# Patient Record
Sex: Female | Born: 1935 | Race: White | Hispanic: No | Smoking: Former smoker
Health system: Southern US, Community
[De-identification: ages and names within clinical notes are randomized; demographics above are authoritative.]

## PROBLEM LIST (undated history)

## (undated) DIAGNOSIS — M898X9 Other specified disorders of bone, unspecified site: Secondary | ICD-10-CM

## (undated) DIAGNOSIS — Z01812 Encounter for preprocedural laboratory examination: Secondary | ICD-10-CM

## (undated) DIAGNOSIS — M19019 Primary osteoarthritis, unspecified shoulder: Secondary | ICD-10-CM

## (undated) DIAGNOSIS — E785 Hyperlipidemia, unspecified: Secondary | ICD-10-CM

## (undated) DIAGNOSIS — I10 Essential (primary) hypertension: Secondary | ICD-10-CM

## (undated) DIAGNOSIS — I2510 Atherosclerotic heart disease of native coronary artery without angina pectoris: Secondary | ICD-10-CM

## (undated) HISTORY — PX: BACK SURGERY: SHX140

## (undated) HISTORY — PX: CORONARY ANGIOPLASTY WITH STENT PLACEMENT: SHX49

## (undated) HISTORY — DX: Hyperlipidemia, unspecified: E78.5

## (undated) HISTORY — DX: Atherosclerotic heart disease of native coronary artery without angina pectoris: I25.10

## (undated) HISTORY — PX: CARDIAC CATHETERIZATION: SHX172

## (undated) HISTORY — PX: ELBOW FRACTURE SURGERY: SHX616

## (undated) HISTORY — DX: Essential (primary) hypertension: I10

---

## 2010-05-02 LAB — METABOLIC PANEL, COMPREHENSIVE
A-G Ratio: 1.1 (ref 0.8–1.7)
ALT (SGPT): 54 U/L (ref 30–65)
AST (SGOT): 24 U/L (ref 15–37)
Albumin: 3.9 g/dL (ref 3.4–5.0)
Alk. phosphatase: 36 U/L — ABNORMAL LOW (ref 50–136)
Anion gap: 9 mmol/L (ref 5–15)
BUN/Creatinine ratio: 18 (ref 12–20)
BUN: 14 MG/DL (ref 7–18)
Bilirubin, total: 0.6 MG/DL (ref 0.1–0.9)
CO2: 25 MMOL/L (ref 21–32)
Calcium: 8.8 MG/DL (ref 8.4–10.4)
Chloride: 100 MMOL/L (ref 100–108)
Creatinine: 0.8 MG/DL (ref 0.6–1.3)
GFR est AA: >60 mL/min/{1.73_m2} (ref >60)
GFR est non-AA: >60 mL/min/{1.73_m2} (ref >60)
Globulin: 3.4 g/dL (ref 2.0–4.0)
Glucose: 105 MG/DL — ABNORMAL HIGH (ref 74–99)
Potassium: 3.6 MMOL/L (ref 3.5–5.5)
Protein, total: 7.3 g/dL (ref 6.4–8.2)
Sodium: 134 MMOL/L — ABNORMAL LOW (ref 136–145)

## 2010-05-02 LAB — CBC WITH AUTOMATED DIFF
ABS. BASOPHILS: 0 10*3/uL (ref 0.0–0.06)
ABS. EOSINOPHILS: 0.1 10*3/uL (ref 0.0–0.4)
ABS. LYMPHOCYTES: 2.2 10*3/uL (ref 0.9–3.6)
ABS. MONOCYTES: 0.6 10*3/uL (ref 0.05–1.2)
ABS. NEUTROPHILS: 5.7 10*3/uL (ref 1.8–8.0)
BASOPHILS: 0 % (ref 0–2)
EOSINOPHILS: 1 % (ref 0–5)
HCT: 37.8 % (ref 35.0–45.0)
HGB: 12.9 g/dL (ref 12.0–16.0)
LYMPHOCYTES: 26 % (ref 21–52)
MCH: 31.6 PG (ref 24.0–34.0)
MCHC: 34.1 g/dL (ref 31.0–37.0)
MCV: 92.6 FL (ref 74.0–97.0)
MONOCYTES: 7 % (ref 3–10)
MPV: 10.3 FL (ref 9.2–11.8)
NEUTROPHILS: 66 % (ref 40–73)
PLATELET: 295 10*3/uL (ref 135–420)
RBC: 4.08 M/uL — ABNORMAL LOW (ref 4.20–5.30)
RDW: 13.8 % (ref 11.6–14.5)
WBC: 8.7 10*3/uL (ref 4.6–13.2)

## 2010-05-02 LAB — PROTHROMBIN TIME + INR
INR: 1 (ref 0.0–1.2)
Prothrombin time: 13.3 SECS (ref 11.5–15.2)

## 2010-05-02 LAB — CARDIAC PANEL,(CK, CKMB & TROPONIN)
CK - MB: 1.6 ng/ml (ref 0.5–3.6)
CK-MB Index: 2.4 % (ref 0.0–4.0)
CK: 66 U/L (ref 26–192)
Troponin-I, QT: <0.04 NG/ML (ref 0.00–0.05)

## 2010-05-02 LAB — PTT: aPTT: 25.5 s (ref 24.6–37.7)

## 2010-05-02 LAB — NT-PRO BNP: NT pro-BNP: 931 PG/ML (ref 0–1800)

## 2010-05-03 LAB — TROPONIN I: Troponin-I, QT: 0.04 NG/ML (ref 0.00–0.05)

## 2010-05-03 LAB — TSH 3RD GENERATION: TSH: 1.62 u[IU]/mL (ref 0.51–6.27)

## 2012-10-29 LAB — METABOLIC PANEL, BASIC
Anion gap: 3 mmol/L (ref 3.0–18)
BUN/Creatinine ratio: 17 (ref 12–20)
BUN: 12 MG/DL (ref 7.0–18)
CO2: 30 MMOL/L (ref 21–32)
Calcium: 9.1 MG/DL (ref 8.5–10.1)
Chloride: 105 MMOL/L (ref 100–108)
Creatinine: 0.72 MG/DL (ref 0.6–1.3)
GFR est AA: >60 mL/min/{1.73_m2} (ref >60)
GFR est non-AA: >60 mL/min/{1.73_m2} (ref >60)
Glucose: 94 MG/DL (ref 74–99)
Potassium: 4.8 MMOL/L (ref 3.5–5.5)
Sodium: 138 MMOL/L (ref 136–145)

## 2012-10-29 LAB — URINALYSIS W/ RFLX MICROSCOPIC
Bilirubin: NEGATIVE
Glucose: NEGATIVE MG/DL
Ketone: NEGATIVE MG/DL
Leukocyte Esterase: NEGATIVE
Nitrites: NEGATIVE
Protein: NEGATIVE MG/DL
Specific gravity: <1.005 (ref 1.003–1.030)
Urobilinogen: 0.2 EU/DL (ref 0.2–1.0)
pH (UA): 6 (ref 5.0–8.0)

## 2012-10-29 LAB — CBC W/O DIFF
HCT: 34.9 % — ABNORMAL LOW (ref 35.0–45.0)
HGB: 11.9 g/dL — ABNORMAL LOW (ref 12.0–16.0)
MCH: 32.2 PG (ref 24.0–34.0)
MCHC: 34.1 g/dL (ref 31.0–37.0)
MCV: 94.3 FL (ref 74.0–97.0)
MPV: 10.1 FL (ref 9.2–11.8)
PLATELET: 268 10*3/uL (ref 135–420)
RBC: 3.7 M/uL — ABNORMAL LOW (ref 4.20–5.30)
RDW: 13.9 % (ref 11.6–14.5)
WBC: 6.3 10*3/uL (ref 4.6–13.2)

## 2012-10-29 LAB — EKG, 12 LEAD, INITIAL
Atrial Rate: 76 {beats}/min
Calculated P Axis: 75 degrees
Calculated R Axis: -29 degrees
Calculated T Axis: 97 degrees
P-R Interval: 174 ms
Q-T Interval: 436 ms
QRS Duration: 136 ms
QTC Calculation (Bezet): 490 ms
Ventricular Rate: 76 {beats}/min

## 2012-10-29 LAB — URINE MICROSCOPIC ONLY
Bacteria: NEGATIVE /HPF
RBC: 0 /HPF (ref 0–5)
WBC: NEGATIVE /HPF (ref 0–5)

## 2012-10-29 LAB — PROTHROMBIN TIME + INR
INR: 1.1 (ref 0.8–1.2)
Prothrombin time: 14 s (ref 11.5–15.2)

## 2012-10-29 LAB — PTT: aPTT: 27.9 s (ref 24.6–37.7)

## 2012-10-29 NOTE — Other (Signed)
Care fusion kit provided with instructions ,patient was instructed to bring her stent card on admission

## 2012-10-30 NOTE — H&P (Signed)
Patient Name:   Marissa Harrell, Marissa Harrell   Date of Birth:  Jan 13, 1928    Chief Complaint:  Back pain and right lower extremity pain.    History of Chief Complaint:t. Ms. Marissa Harrell is an 78 year old woman who is being seen for back and right lower extremity pain. The patient is being seen at the request of Dr. Cathlean Cower. She has had right lower extremity pain, inner thigh, and anterior lower leg pain for the past six or seven weeks. She recent had a sudden onset of pain in the right buttock and posterior thigh pain while walking. The pain travels down to the right foot.  She is an avid walker. She does not remember a specific injury. The pain has been somewhat intermittent. She has had no numbness. She has had some weakness in the right foot, and it seems to drag. She has had no bowel or bladder dysfunction or problems with her balance. She finds that walking, climbing stairs, and rolling over in bed make her pain worse. Her pain worsens throughout the day and is at its worst in the evenings.  Taking Percocet helps her a lot. She had a DEXA scan done in 2012 that revealed osteopenia.  She has been doing the exercise program which really does not seem to be helping; and in fact, the stretches seem to make it a bit worse. She gets numbness and tingling on the right side at times.She epidural steroid injection only helped for about 48 hours and the pain has returned again.  She still has difficulty with leaning forward on the right side.    Past Medical History:  Hypertension, rheumatoid disease, and Sj??gren???s syndrome.    Past Surgical History:  Appendectomy, cardiac stent placement, and ectopic pregnancy.    Allergies:   The patient has no known drug allergies.    Current Medications:  Nexium, Vytorin, Plaquenil, Micardis, Coreg, Evoxac, and Fosamax.     Social History:  The patient is a former smoker of cigarettes for ten years.    Family History:  Hypertension and heart disease.       Review of Systems:  A complete review of  systems was completed with pertinent positives including hemorrhoids.  Pertinent negatives include fever, chills, night sweats, nausea, vomiting, dark stools, chest pain, shortness of breath, edema, visual changes, dizziness, bowel or bladder dysfunction, hematuria, morning stiffness, wheezing, cough, tuberculosis, numbness, rashes, bruising, or difficulty sleeping.     Physical Examination:    General:  The patient appears healthy. Blood Pressure: 148/71; Height: 5 feet 1 inch; Weight: 90.   Cardiovascular:  Arterial pulses are normal.  Heart- RRR  Lungs-CTA bil  Abdomen- +BS,soft,nontender  Skin:  The skin is normal appearing with no contusions or wounds noted.  Musculoskeletal:  There is tenderness of the right PSIS and right greater trochanter. The thoracolumbar spine has normal kyphosis, a normal appearance, and no scoliosis.  There is full range of motion of the cervical, thoracic, and lumbar spine and of the hips, knees, and ankles.  Straight leg raising and crossed straight leg raising tests are negative.  Neurological:  There is no weakness in the thoracic, lumbar, or sacral spine or in the lower extremities or hips.  Deep tendon reflexes are present and normal bilaterally.  Ankle and knee jerks are normal with no clonus.     Radiograph Examination:  AP view of the pelvis was obtained and interpreted in the office 07/24/12  and reveals no periosteal reaction, no medullary lesions, no osteopenia,  well-aligned joint spaces, no chondrolysis, and no fractures.    AP, lateral, oblique, flexion, and extension views of the lumbar spine were obtained and interpreted in the office 07/24/12, which reveals left lateral subluxation of L4 on L5 as well as degenerative disc disease at L2-3.    Review of her MRI scan [facility] 08/22/12 reveals severe spinal stenosis at L3-4 as well as degenerative disc disease and spinal stenosis at L4-5.    Impression:  Ms. Marissa Harrell and I had a long discussion about treatments for her spinal stenosis  including surgical intervention, the risks, and benefits as well as the different surgical approaches and decision making.  We also discussed nonsurgical care for this condition including medications, injections, physical therapy, rehabilitation, activity modification, and brace utilization.  At this point, she would like to proceed with operative intervention.  We will plan for L3 to L5 decompression and fusion .  The risks and benefits have been described.

## 2012-10-31 ENCOUNTER — Encounter

## 2012-10-31 LAB — CULTURE, URINE
Culture result:: NO GROWTH
Culture: NO GROWTH

## 2012-10-31 NOTE — Other (Signed)
Notified Kay/Dr Lisette Grinder of anes request for stress test and xray followup.  Faxed xray,ekg and cardiac note

## 2012-11-04 ENCOUNTER — Inpatient Hospital Stay
Admit: 2012-11-04 | Discharge: 2012-11-07 | Disposition: A | Discharge status: Home Health | Payer: MEDICARE | Attending: Orthopaedic Surgery | Admitting: Orthopaedic Surgery

## 2012-11-04 DIAGNOSIS — M47817 Spondylosis without myelopathy or radiculopathy, lumbosacral region: Secondary | ICD-10-CM

## 2012-11-04 MED ADMIN — cycloSPORINE (RESTASIS) 0.05 % ophthalmic emulsion 1 Drop: OPHTHALMIC | @ 23:00:00 | NDC 00023916330

## 2012-11-04 MED ADMIN — HYDROmorphone (PF) (DILAUDID) injection 1 mg: INTRAVENOUS | @ 19:00:00 | NDC 00409128331

## 2012-11-04 MED ADMIN — sodium chloride (NS) flush 5-10 mL: INTRAVENOUS | @ 19:00:00 | NDC 87701099893

## 2012-11-04 MED ADMIN — cycloSPORINE (RESTASIS) 0.05 % ophthalmic emulsion 1 Drop: OPHTHALMIC | @ 18:00:00 | NDC 60432014050

## 2012-11-04 MED ADMIN — docusate sodium (COLACE) capsule 100 mg: ORAL | @ 19:00:00 | NDC 62584068311

## 2012-11-04 MED ADMIN — bacitracin 100,000 Units in 0.9% sodium chloride 1,000 mL Irrigation: @ 14:00:00 | NDC 00409798309

## 2012-11-04 MED ADMIN — ceFAZolin (ANCEF) 1 g in 0.9% sodium chloride (MBP/ADV) 50 mL MBP: INTRAVENOUS | @ 19:00:00 | NDC 00338055311

## 2012-11-04 MED ADMIN — acetaminophen (OFIRMEV) infusion 1,000 mg: INTRAVENOUS | @ 16:00:00 | NDC 43825010201

## 2012-11-04 MED ADMIN — HYDROmorphone (PF) (DILAUDID) 30 mg / 30 mL PCA: INTRAVENOUS | @ 17:00:00 | NDC 09999179730

## 2012-11-04 MED ADMIN — bacitracin 100,000 Units in lactated ringers 1,000 mL Irrigation: @ 14:00:00 | NDC 00409795309

## 2012-11-04 MED ADMIN — fentaNYL citrate (PF) injection 50 mcg: INTRAVENOUS | @ 16:00:00 | NDC 00641602701

## 2012-11-04 MED ADMIN — lactated ringers infusion: INTRAVENOUS | @ 20:00:00 | NDC 00409795309

## 2012-11-04 MED ADMIN — ondansetron (ZOFRAN) injection 4 mg: INTRAVENOUS | @ 20:00:00 | NDC 10019090501

## 2012-11-04 MED ADMIN — lactated ringers infusion: INTRAVENOUS | @ 12:00:00 | NDC 00409795309

## 2012-11-04 MED ADMIN — fentaNYL citrate (PF) 50 mcg/mL injection: @ 16:00:00 | NDC 00409909425

## 2012-11-04 MED ADMIN — ceFAZolin (ANCEF) 2g IVPB in 50 mL D5W: INTRAVENOUS | @ 13:00:00 | NDC 00264310511

## 2012-11-04 MED ADMIN — HYDROmorphone (PF) (DILAUDID) injection 1 mg: INTRAVENOUS | NDC 00409128331

## 2012-11-04 MED ADMIN — HYDROmorphone (PF) (DILAUDID) 30 mg / 30 mL PCA: INTRAVENOUS | @ 16:00:00 | NDC 09999179730

## 2012-11-04 MED FILL — HYDROMORPHONE (PF) 1 MG/ML IJ SOLN: 1 mg/mL | INTRAMUSCULAR | Qty: 1

## 2012-11-04 MED FILL — HEPARIN (PORCINE) 10,000 UNIT/ML IJ SOLN: 10000 unit/mL | INTRAMUSCULAR | Qty: 3

## 2012-11-04 MED FILL — ROCURONIUM 10 MG/ML IV: 10 mg/mL | INTRAVENOUS | Qty: 5

## 2012-11-04 MED FILL — LACTATED RINGERS IV: INTRAVENOUS | Qty: 1000

## 2012-11-04 MED FILL — VYTORIN 10 MG-40 MG TABLET: 10-40 mg | ORAL | Qty: 1

## 2012-11-04 MED FILL — MIDAZOLAM 1 MG/ML IJ SOLN: 1 mg/mL | INTRAMUSCULAR | Qty: 2

## 2012-11-04 MED FILL — OFIRMEV 1,000 MG/100 ML (10 MG/ML) INTRAVENOUS SOLUTION: 1000 mg/100 mL (10 mg/mL) | INTRAVENOUS | Qty: 100

## 2012-11-04 MED FILL — HYDROMORPHONE 30 MG/30 ML (1 MG/ML) IN 0.9% NACL INFUSION: 30 mg/ mL | INTRAVENOUS | Qty: 30

## 2012-11-04 MED FILL — FENTANYL CITRATE (PF) 50 MCG/ML IJ SOLN: 50 mcg/mL | INTRAMUSCULAR | Qty: 5

## 2012-11-04 MED FILL — BD POSIFLUSH NORMAL SALINE 0.9 % INJECTION SYRINGE: INTRAMUSCULAR | Qty: 10

## 2012-11-04 MED FILL — GLYCOPYRROLATE 0.2 MG/ML IJ SOLN: 0.2 mg/mL | INTRAMUSCULAR | Qty: 2

## 2012-11-04 MED FILL — DOCUSATE SODIUM 100 MG CAP: 100 mg | ORAL | Qty: 1

## 2012-11-04 MED FILL — CEFAZOLIN 2 GM/50 ML IN DEXTROSE (ISO-OSMOTIC) IVPB: 2 gram/50 mL | INTRAVENOUS | Qty: 50

## 2012-11-04 MED FILL — BACITRACIN 50,000 UNIT IM: 50000 unit | INTRAMUSCULAR | Qty: 200000

## 2012-11-04 MED FILL — RESTASIS 0.05 % EYE DROPS IN A DROPPERETTE: 0.05 % | OPHTHALMIC | Qty: 1

## 2012-11-04 MED FILL — ONDANSETRON (PF) 4 MG/2 ML INJECTION: 4 mg/2 mL | INTRAMUSCULAR | Qty: 2

## 2012-11-04 MED FILL — SODIUM CHLORIDE 0.9 % IV: INTRAVENOUS | Qty: 1000

## 2012-11-04 MED FILL — CEFAZOLIN 1 GRAM SOLUTION FOR INJECTION: 1 gram | INTRAMUSCULAR | Qty: 1000

## 2012-11-04 MED FILL — XYLOCAINE-MPF 20 MG/ML (2 %) INJECTION SOLUTION: 20 mg/mL (2 %) | INTRAMUSCULAR | Qty: 5

## 2012-11-04 MED FILL — FENTANYL CITRATE (PF) 50 MCG/ML IJ SOLN: 50 mcg/mL | INTRAMUSCULAR | Qty: 2

## 2012-11-04 MED FILL — SODIUM CHLORIDE 0.9 % INJECTION: INTRAMUSCULAR | Qty: 20

## 2012-11-04 MED FILL — PHENYLEPHRINE 10 MG/ML INJECTION: 10 mg/mL | INTRAMUSCULAR | Qty: 1

## 2012-11-04 MED FILL — DEXAMETHASONE SODIUM PHOSPHATE 4 MG/ML IJ SOLN: 4 mg/mL | INTRAMUSCULAR | Qty: 1

## 2012-11-04 MED FILL — FLEET ENEMA 19 GRAM-7 GRAM/118 ML: 19-7 gram/118 mL | RECTAL | Qty: 133

## 2012-11-04 MED FILL — PROPOFOL 10 MG/ML IV EMUL: 10 mg/mL | INTRAVENOUS | Qty: 20

## 2012-11-04 MED FILL — HESPAN 6 % IN NS INTRAVENOUS SOLUTION: 6 % | INTRAVENOUS | Qty: 500

## 2012-11-04 NOTE — Progress Notes (Signed)
Bedside and Verbal shift change report given to B.Bagsby RN (oncoming nurse) by S.Thomas RN (offgoing nurse).  Report given with SBAR, Kardex and MAR.

## 2012-11-04 NOTE — Progress Notes (Signed)
Dr. Almon Register, hospitalist, returned paged for medical consult.

## 2012-11-04 NOTE — Op Note (Signed)
Patient: Marissa Harrell MRN: [id number]  SSN: [id number]    Date of Birth: 11/04/1928  Age: 78 y.o.  Sex: female        Date of Procedure: 11/04/2012   Preoperative Diagnosis: LUMBAR SPONDYLOSIS, LUMBAGO,SCIATICA, SPINAL STENOSIS, HISTORY CARDIOVASCULAR DISEASE  (CARDIAC STENTS)  Postoperative Diagnosis: LUMBAR SPONDYLOSIS, SCIATICA    Procedure: Procedure(s) with comments:  L3-5 DECOMPRESSION & FUSION W/C-ARM - L3-5 DECOMPRESSION & FUSION W/C-ARM  Surgeon(s) and Role:     * Gurnie Duris R Yahir Tavano, MD - Primary  Assistant: Tonia Yocum PA-C  Anesthesia: General   Estimated Blood Loss: 100cc  Fluids: 1000cc   Specimens: * No specimens in log *   Findings: same  Complications: none  Implants:   Implant Name Type Inv. Item Serial No. Manufacturer Lot No. LRB No. Used Action   GRAFT BNE DEMINERALIZED 10CC -- DBMPURE MACRO - [id number]  [id number] SPINEFRONTIER  N/A 1 Implanted   SCR PDCL SLD PEDFUSE 7.0X50MM --  - [id number]  [id number]  SPINEFRONTIER [id number] N/A 4 Implanted   ROD SP LORDTC PEDFUSE 5.5X75MM --  - [id number]  [id number]  SPINEFRONTIER [id number] N/A 2 Implanted   SCR SET PEDFUSE 5-7MM --  - [id number]  [id number]  SPINEFRONTIER [id number] N/A 6 Implanted   SCR PDCL SLD PEDFUSE 7.0X45MM --  - [id number]  [id number]  SPINEFRONTIER [id number] N/A 2 Implanted   CONN X ADJ PEDFUSE ANGL 55MM --  - [id number]   [id number]   SPINEFRONTIER [id number] N/A 1 Implanted         Indications: This is a 78 y.o. year-old female who presents with significant back   and bilateral lower extremity pain, worsening ability to do activities of daily living. X-rays and MRI scan revealing severe spinal stenosis, degenerative disk disease and disk herniation. Having failed conservative care, he comes for operative intervention.      DESCRIPTION OF PROCEDURE: After correct identification, the patient was   taken to the operating room, placed supine on the table, general   endotracheal anesthesia induced.  2grams of Ancef was given. Patient  was then rotated prone onto the Jackson table. Lumbar spine was prepped and draped in the usual sterile fashion. Midline incision was made in the lumbar spine, taken down to the spinous processes of L2-5.  The posterior transverse processes bilaterally were expose at L3-5 as seen on intraoperative fluoroscopy. Gelpi retractors were then placed. The wound was then irrigated.   Complete wide laminectomy was performed at L4 as well as the inferior   half of L3 bilaterally and the superior half of L5 bilaterally. Removal of more than 1/2 of the medial facets bilaterally allowed excellent midline decompression. Foraminotomies which included undercutting the pars intra-articularis were then performed bilaterally at L3-5 until a Penfield 3 was easily passed through each of the neural foramen. The wound   was then irrigated. Bur was then used to open the posterior aspect of the   pedicles bilaterally at L3-5. A gearshift probe was then placed through   each of these posterior bur holes, through the pedicle, into vertebral body   under intraoperative fluoroscopy. Ball-tipped probe was then placed through   each gearshift tracts, ensuring the gearshift had only gone to the bone.   Pedicle screws from SpineFrontier spinal system were then placed bilaterally at L3-5.   Rods were then fixed to the pedicle screws using the locking caps. Each of these caps were then torqued into position. Intraoperative x-ray revealed excellent alignment of the  hardware and anatomy. Wound   was then irrigated with 1000cc of pulse lavage irrigation containing Bacitracin. Bur was then used to open the posterior aspect of the facet joints and transverse process bilaterally as well as removing of the cartilage surface of the facet joints.  Bone graft that was taken from the laminectomy site was then placed in the   posterolateral gutters as well as in facet joints. Drain was then placed.   Fascia was then closed using #1 Vicryl suture. Subcutaneous  tissue   approximated with 2-0 Vicryl suture. Skin approximated with staples. Sterile dressing was applied.   The patient tolerated the procedure well and taken to Recovery room in good   condition.

## 2012-11-04 NOTE — Other (Addendum)
TRANSFER - OUT REPORT:    Verbal report given to Trula Ore RN,  on Marissa Harrell  being transferred to 2AB(unit) for routine post - op       Report consisted of patient???s Situation, Background, Assessment and   Recommendations(SBAR).     Information from the following report(s) SBAR and Procedure Summary was reviewed with the receiving nurse.    Opportunity for questions and clarification was provided.

## 2012-11-04 NOTE — Consults (Signed)
History and Physical    Patient: Marissa Harrell               Sex: female          DOA: 11/04/2012       Date of Birth:  1928/08/05      Age:  78 y.o.        LOS:  LOS: 0 days        HPI:     Jesi Goldhammer is a 78 y.o. female who presented to the hospital by the Orthopedics service for lumbar spine surgery.  She has a known history of CAD and has had three stents placed, her Cardiologist is Dr Tereasa Coop.  She has tolerated the surgery well.  She reports that her heart disease has been well controlled and that she has not been having chest pain.  She has not shortness of breath.  She has significant dry mouth symptoms because of her Sjogren's syndrome.  We are consulted for assistance with ongoing medical management.    Past Medical History   Diagnosis Date   ??? Sjogren's syndrome    ??? Hypertension    ??? Heart attack    ??? Arthritis    ??? CAD (coronary artery disease)      s/p stents, EF 33%   ??? GERD (gastroesophageal reflux disease)      well controlled   ??? Psychiatric disorder      anxiety   ??? Nausea & vomiting    ??? Chronic kidney disease      renal artery stenosis       Social History:   Tobacco use:  Patient reports stopping smoking 40 years ago   Alcohol use:  Patient occasionally drinks red wine with dinner   Occupation:  Patient lives alone, she is a Armed forces training and education officer.    Family History:   Patient's father died in his 59's of a heart attack   Patient's mother died when the patient was an infant.    Review of Systems    Constitutional:  No fever or weight loss  HEENT:  No headache or visual changes  Cardiovascular:  No chest pain or diaphoresis  Respiratory:  No coughing, wheezing, or shortness of breath.  GI:  No nausea or vomitting.  No diarrhea  GU:  No hematuria or dysuria  Skin:  No rashes or moles  Neuro:  No seizures or syncope  Hematological:  No bruising or bleeding  Endocrine:  No diabetes or thyroid disease    Physical Exam:      Visit Vitals   Item Reading   ??? BP 105/51   ??? Pulse 76   ??? Temp 97.3 ??F (36.3 ??C)    ??? Resp 14   ??? Ht 5\' 1"  (1.549 m)   ??? Wt 39.945 kg (88 lb 1 oz)   ??? BMI 16.65 kg/m2   ??? SpO2 99%       Physical Exam:    Gen:  No distress, alert  HEENT:  Normal cephalic atraumatic, extra-occular movements are intact.  Neck:  Supple, No JVD  Lungs:  Clear bilaterally, no wheeze, no rales, normal effort  Heart:  Regular Rate and Rhythm, normal S1 and S2, no edema  Abdomen:  Soft, non tender, normal bowel sounds, no guarding.  Extremities:  Well perfused, no cyanosis or edema  Neurological:  Awake and alert, CN's are intact, normal strength throughout extremities  Skin:  No rashes or moles  Mental Status:  Normal thought process,  does not appear anxious    Laboratory Studies:    BMP:   No results found for this basename: na, k, cl, co2, AGAP, glu, bun, crea, GFRAA, GFRNA     CBC:   No results found for this basename: wbc, hgb, hct, plt       Assessment/Plan     Principal Problem:    Spinal stenosis, lumbar (10/30/2012)    Active Problems:    DDD (degenerative disc disease), lumbar (10/30/2012)      CAD  Sjogren's syndrome  HTN  Renal artery stenosis    PLAN:    Patient has significant comorbid illness but it seems to be well controlled.  Will monitor BP and renal function  Will also monitor H/H  Pain control and mobilization per Ortho.

## 2012-11-04 NOTE — Progress Notes (Signed)
Received report from B.Patrick North RN, PACU for patient transfer to room #229.

## 2012-11-04 NOTE — Interval H&P Note (Signed)
H&P Update:  Marissa Harrell was seen and examined.  History and physical has been reviewed. There have been no significant clinical changes since the completion of the originally dated History and Physical.    Signed By: Dixon Boos, MD     November 04, 2012 7:25 AM

## 2012-11-04 NOTE — Progress Notes (Signed)
Post-Anesthesia Evaluation & Assessment    Visit Vitals   Item Reading   ??? BP 162/86   ??? Pulse 76   ??? Temp 97.7 ??F (36.5 ??C)   ??? Resp 14   ??? Ht 5\' 1"  (1.549 m)   ??? Wt 39.945 kg (88 lb 1 oz)   ??? BMI 16.65 kg/m2   ??? SpO2 100%       Nausea/Vomiting: Controlled.    Post-operative hydration adequate.    Pain Scale 1: Numeric (0 - 10) (11/04/12 1120)  Pain Intensity 1: 3 (11/04/12 1120)   Managed    Pain score at or below stated goal level.    Mental status & Level of consciousness: alert and oriented x 3    Neurological status: moves all extremities, sensation grossly intact    Pulmonary status: airway patent, adequate oxygenation.    Complications related to anesthesia: none    Patient has met all discharge requirements.      Hulan Amato Arhan Mcmanamon, DO

## 2012-11-04 NOTE — Progress Notes (Signed)
PT order received and chart reviewed. Holding PT at this time due to pt being in severe pain per nursing. Will f/u later as time allows. Thank you.

## 2012-11-04 NOTE — Progress Notes (Signed)
Assumed care of patient. Patient in bed, HOB elevated. Pain voiced at 2/10. Rest as intervention. Encouraged PCA use.Shift assessment completed. All questions and concerns answered. All needs met. Bed in the lowest position. Call bell within reach. Will continue to monitor for changes. Daughter at bedside.

## 2012-11-04 NOTE — Progress Notes (Signed)
Received from PACU to room #229 via bed accompanied by PACU nurse and transporter.Patient received in satisfactory condition. Post op l3-5 decompression and fusion. Surgeon is Dr. Lisette Grinder. Dilaudid PCA and peripheral iv infusing via without incidence. Full body assessment completed,see doc flow sheet in computer. Dressing lumbar clean dry intact with hemovac in place with mod amt sanguineous drainage neurovascular status intact.denies numb and or tingling sensation.positive pulses dorsal/tibial. Instructed in ordering diet.  Side rails up x3, call light with in reach on bed.

## 2012-11-04 NOTE — Op Note (Signed)
Patient: Marissa Harrell MRN: [id number]  SSN: [id number]    Date of Birth: [birth year]  Age: 78 y.o.  Sex: female        Date of Procedure: 11/04/2012   Preoperative Diagnosis: LUMBAR SPONDYLOSIS, LUMBAGO,SCIATICA, SPINAL STENOSIS, HISTORY CARDIOVASCULAR DISEASE  (CARDIAC STENTS)  Postoperative Diagnosis: LUMBAR SPONDYLOSIS, SCIATICA    Procedure: Procedure(s) with comments:  L3-5 DECOMPRESSION & FUSION W/C-ARM - L3-5 DECOMPRESSION & FUSION W/C-ARM  Surgeon(s) and Role:     * Dixon Boos, MD - Primary  Assistant: Luane School PA-C  Anesthesia: General   Estimated Blood Loss: 100cc  Fluids: 1000cc   Specimens: * No specimens in log *   Findings: same  Complications: none  Implants:   Implant Name Type Inv. Item Serial No. Manufacturer Lot No. LRB No. Used Action   GRAFT BNE DEMINERALIZED 10CC -- DBMPURE MACRO - [id number]  [id number] SPINEFRONTIER  N/A 1 Implanted   SCR PDCL SLD PEDFUSE 7.0X50MM --  - [id number]  [id number]  SPINEFRONTIER [id number] N/A 4 Implanted   ROD SP LORDTC PEDFUSE 5.5X75MM --  - [id number]  [id number]  SPINEFRONTIER [id number] N/A 2 Implanted   SCR SET PEDFUSE 5-7MM --  - [id number]  [id number]  SPINEFRONTIER [id number] N/A 6 Implanted   SCR PDCL SLD PEDFUSE 7.0X45MM --  - [id number]  [id number]  SPINEFRONTIER [id number] N/A 2 Implanted   CONN X ADJ PEDFUSE ANGL --  - [id number]   [id number]   SPINEFRONTIER [id number] N/A 1 Implanted         Indications: This is a 78 y.o. year-old female who presents with significant back   and bilateral lower extremity pain, worsening ability to do activities of daily living. X-rays and MRI scan revealing severe spinal stenosis, degenerative disk disease and disk herniation. Having failed conservative care, he comes for operative intervention.      DESCRIPTION OF PROCEDURE: After correct identification, the patient was   taken to the operating room, placed supine on the table, general   endotracheal anesthesia induced.  2grams of Ancef was given. Patient  was then rotated prone onto the [facility] table. Lumbar spine was prepped and draped in the usual sterile fashion. Midline incision was made in the lumbar spine, taken down to the spinous processes of L2-5.  The posterior transverse processes bilaterally were expose at L3-5 as seen on intraoperative fluoroscopy. Gelpi retractors were then placed. The wound was then irrigated.   Complete wide laminectomy was performed at L4 as well as the inferior   half of L3 bilaterally and the superior half of L5 bilaterally. Removal of more than 1/2 of the medial facets bilaterally allowed excellent midline decompression. Foraminotomies which included undercutting the pars intra-articularis were then performed bilaterally at L3-5 until a Penfield 3 was easily passed through each of the neural foramen. The wound   was then irrigated. Bur was then used to open the posterior aspect of the   pedicles bilaterally at L3-5. A gearshift probe was then placed through   each of these posterior bur holes, through the pedicle, into vertebral body   under intraoperative fluoroscopy. Ball-tipped probe was then placed through   each gearshift tracts, ensuring the gearshift had only gone to the bone.   Pedicle screws from SpineFrontier spinal system were then placed bilaterally at L3-5.   Rods were then fixed to the pedicle screws using the locking caps. Each of these caps were then torqued into position. Intraoperative x-ray revealed excellent alignment of the  hardware and anatomy. Wound  was then irrigated with 1000cc of pulse lavage irrigation containing Bacitracin. Bur was then used to open the posterior aspect of the facet joints and transverse process bilaterally as well as removing of the cartilage surface of the facet joints.  Bone graft that was taken from the laminectomy site was then placed in the   posterolateral gutters as well as in facet joints. Drain was then placed.   Fascia was then closed using #1 Vicryl suture. Subcutaneous  tissue   approximated with 2-0 Vicryl suture. Skin approximated with staples. Sterile dressing was applied.   The patient tolerated the procedure well and taken to Recovery room in good   condition.

## 2012-11-05 LAB — METABOLIC PANEL, BASIC
Anion gap: 8 mmol/L (ref 3.0–18)
BUN/Creatinine ratio: 16 (ref 12–20)
BUN: 14 MG/DL (ref 7.0–18)
CO2: 24 MMOL/L (ref 21–32)
Calcium: 7.5 MG/DL — ABNORMAL LOW (ref 8.5–10.1)
Chloride: 103 MMOL/L (ref 100–108)
Creatinine: 0.89 MG/DL (ref 0.6–1.3)
GFR est AA: >60 mL/min/{1.73_m2} (ref >60)
GFR est non-AA: >60 mL/min/{1.73_m2} (ref >60)
Glucose: 96 MG/DL (ref 74–99)
Potassium: 3.5 MMOL/L (ref 3.5–5.5)
Sodium: 135 MMOL/L — ABNORMAL LOW (ref 136–145)

## 2012-11-05 LAB — CBC WITH AUTOMATED DIFF
ABS. BASOPHILS: 0 10*3/uL (ref 0.0–0.06)
ABS. EOSINOPHILS: 0 10*3/uL (ref 0.0–0.4)
ABS. LYMPHOCYTES: 1.8 10*3/uL (ref 0.9–3.6)
ABS. MONOCYTES: 0.9 10*3/uL (ref 0.05–1.2)
ABS. NEUTROPHILS: 6.4 10*3/uL (ref 1.8–8.0)
BASOPHILS: 0 % (ref 0–2)
EOSINOPHILS: 0 % (ref 0–5)
HCT: 24.6 % — ABNORMAL LOW (ref 35.0–45.0)
HGB: 8.4 g/dL — ABNORMAL LOW (ref 12.0–16.0)
LYMPHOCYTES: 20 % — ABNORMAL LOW (ref 21–52)
MCH: 31.9 PG (ref 24.0–34.0)
MCHC: 34.1 g/dL (ref 31.0–37.0)
MCV: 93.5 FL (ref 74.0–97.0)
MONOCYTES: 9 % (ref 3–10)
MPV: 10.1 FL (ref 9.2–11.8)
NEUTROPHILS: 71 % (ref 40–73)
PLATELET: 189 10*3/uL (ref 135–420)
RBC: 2.63 M/uL — ABNORMAL LOW (ref 4.20–5.30)
RDW: 13.6 % (ref 11.6–14.5)
WBC: 9 10*3/uL (ref 4.6–13.2)

## 2012-11-05 MED ADMIN — carvedilol (COREG) tablet 6.25 mg: ORAL | @ 14:00:00 | NDC 68084026111

## 2012-11-05 MED ADMIN — ondansetron (ZOFRAN) injection 4 mg: INTRAVENOUS | @ 21:00:00 | NDC 00409475503

## 2012-11-05 MED ADMIN — sodium chloride (NS) flush 5-10 mL: INTRAVENOUS | @ 11:00:00 | NDC 87701099893

## 2012-11-05 MED ADMIN — acetaminophen (TYLENOL) tablet 650 mg: ORAL | @ 15:00:00 | NDC 00904198261

## 2012-11-05 MED ADMIN — ceFAZolin (ANCEF) 1 g in 0.9% sodium chloride (MBP/ADV) 50 mL MBP: INTRAVENOUS | @ 10:00:00 | NDC 00781345170

## 2012-11-05 MED ADMIN — cycloSPORINE (RESTASIS) 0.05 % ophthalmic emulsion 1 Drop: OPHTHALMIC | @ 14:00:00 | NDC 00023916330

## 2012-11-05 MED ADMIN — ondansetron (ZOFRAN) injection 4 mg: INTRAVENOUS | @ 16:00:00 | NDC 00409475503

## 2012-11-05 MED ADMIN — HYDROmorphone (PF) (DILAUDID) 30 mg / 30 mL PCA: INTRAVENOUS | @ 01:00:00 | NDC 09999179730

## 2012-11-05 MED ADMIN — docusate sodium (COLACE) capsule 100 mg: ORAL | @ 14:00:00 | NDC 62584068311

## 2012-11-05 MED ADMIN — sodium chloride (NS) flush 5-10 mL: INTRAVENOUS | @ 02:00:00 | NDC 87701099893

## 2012-11-05 MED ADMIN — docusate sodium (COLACE) capsule 100 mg: ORAL | @ 23:00:00 | NDC 62584068311

## 2012-11-05 MED ADMIN — ceFAZolin (ANCEF) 1 g in 0.9% sodium chloride (MBP/ADV) 50 mL MBP: INTRAVENOUS | @ 02:00:00 | NDC 00781345170

## 2012-11-05 MED ADMIN — HYDROmorphone (PF) (DILAUDID) injection 1 mg: INTRAVENOUS | @ 11:00:00 | NDC 00409128331

## 2012-11-05 MED ADMIN — HYDROmorphone (DILAUDID) tablet 2-4 mg: ORAL | @ 15:00:00 | NDC 68084042311

## 2012-11-05 MED ADMIN — carvedilol (COREG) tablet 6.25 mg: ORAL | @ 23:00:00 | NDC 68084026111

## 2012-11-05 MED ADMIN — telmisartan (MICARDIS) tablet 20 mg: ORAL | @ 15:00:00 | NDC 00597004037

## 2012-11-05 MED ADMIN — HYDROmorphone (PF) (DILAUDID) 30 mg / 30 mL PCA: INTRAVENOUS | @ 12:00:00 | NDC 09999179730

## 2012-11-05 MED ADMIN — cycloSPORINE (RESTASIS) 0.05 % ophthalmic emulsion 1 Drop: OPHTHALMIC | @ 23:00:00 | NDC 00023916330

## 2012-11-05 MED ADMIN — magnesium oxide (MAG-OX) tablet 200 mg: ORAL | @ 15:00:00 | NDC 63739035410

## 2012-11-05 MED ADMIN — hydroxychloroquine (PLAQUENIL) tablet 200 mg: ORAL | @ 15:00:00 | NDC 68084026911

## 2012-11-05 MED FILL — DOCUSATE SODIUM 100 MG CAP: 100 mg | ORAL | Qty: 1

## 2012-11-05 MED FILL — HYDROMORPHONE 2 MG TAB: 2 mg | ORAL | Qty: 1

## 2012-11-05 MED FILL — SIMVASTATIN 20 MG TAB: 20 mg | ORAL | Qty: 2

## 2012-11-05 MED FILL — ZETIA 10 MG TABLET: 10 mg | ORAL | Qty: 1

## 2012-11-05 MED FILL — CEFAZOLIN 1 GRAM SOLUTION FOR INJECTION: 1 gram | INTRAMUSCULAR | Qty: 1000

## 2012-11-05 MED FILL — ONDANSETRON (PF) 4 MG/2 ML INJECTION: 4 mg/2 mL | INTRAMUSCULAR | Qty: 2

## 2012-11-05 MED FILL — MICARDIS 40 MG TABLET: 40 mg | ORAL | Qty: 1

## 2012-11-05 MED FILL — HYDROXYCHLOROQUINE 200 MG TAB: 200 mg | ORAL | Qty: 1

## 2012-11-05 MED FILL — RESTASIS 0.05 % EYE DROPS IN A DROPPERETTE: 0.05 % | OPHTHALMIC | Qty: 1

## 2012-11-05 MED FILL — DIAZEPAM 5 MG TAB: 5 mg | ORAL | Qty: 1

## 2012-11-05 MED FILL — MAGNESIUM OXIDE 400 MG TAB: 400 mg | ORAL | Qty: 1

## 2012-11-05 MED FILL — CARVEDILOL 3.125 MG TAB: 3.125 mg | ORAL | Qty: 2

## 2012-11-05 MED FILL — MAPAP (ACETAMINOPHEN) 325 MG TABLET: 325 mg | ORAL | Qty: 2

## 2012-11-05 MED FILL — HYDROMORPHONE (PF) 1 MG/ML IJ SOLN: 1 mg/mL | INTRAMUSCULAR | Qty: 1

## 2012-11-05 NOTE — Progress Notes (Signed)
Bedside and Verbal shift change report given to T. Samaheen, RN (oncoming nurse) by I. Balcorta, RN (offgoing nurse).  Report given with SBAR, Kardex and MAR.

## 2012-11-05 NOTE — Progress Notes (Signed)
Pt is lying in bed.  Pt has headache and back pain. Pt was medicated with Tylenol 650 mg and Dilaudid 2 mg po.  Pt's PCA was discontinued earlier this morning.  Abd dressing to back dry and intact.  Hemovac intact draining sanguinous drainage.  Wit foley cath draining clear yellow urine.

## 2012-11-05 NOTE — Progress Notes (Signed)
Problem: Mobility Impaired (Adult and Pediatric)  Goal: *Acute Goals and Plan of Care (Insert Text)  Physical Therapy Goals  Initiated 11/05/2012 and to be accomplished within 1-4 day(s)  Bed mobility: Demonstrate proper log-roll technique for safe initiation of rolling for OOB activities.  Transfers:   Supine to sit to supine S with HR for meals.  Sit to stand to sit S with RW/LSO in prep for ambulation.  Gait: Ambulate 181ft S with RW/LSO, for home/community mobility.  Activity tolerance: Tolerate up in chair 30 minutes-1 hour for ADL???s.  Patient/Family Education: Patient/family to be independent with HEP for follow-up care and safe discharge.   Outcome: Progressing Towards Goal  PHYSICAL THERAPY TREATMENT    Patient: Marissa Harrell (78 y.o. female)  Date: 11/05/2012  Diagnosis: LUMBAR SPONDYLOSIS, LUMBAGO,SCIATICA, SPINAL STENOSIS, HISTORY CARDIOVASCULAR DISEASE (CARDIAC STENTS)  Spinal stenosis, lumbar Spinal stenosis, lumbar  Procedure(s) (LRB):  L3-5 DECOMPRESSION & FUSION W/C-ARM (N/A) 1 Day Post-Op  Precautions: Fall;Spinal;Back  Chart, physical therapy assessment, plan of care and goals were reviewed.      ASSESSMENT:  Patient showing slight improvement with mobility. Pt able to increase ambulation distance. Pt demonstrating a decrease tolerance to activity and need multiple standing rest. Cont POC.    Progression toward goals:  [ ]       Improving appropriately and progressing toward goals  [X]       Improving slowly and progressing toward goals  [ ]       Not making progress toward goals and plan of care will be adjusted       PLAN:  Patient continues to benefit from skilled intervention to address the above impairments. Continue treatment per established plan of care.  Discharge Recommendations:  Skilled Nursing Facility  Further Equipment Recommendations for Discharge:  rolling walker       SUBJECTIVE:   Patient stated ??? Im really in pain???      OBJECTIVE DATA SUMMARY:   Critical Behavior:  Neurologic  State: Alert;Appropriate for age  Orientation Level: Oriented X4  Cognition: Appropriate decision making;Appropriate for age attention/concentration;Appropriate safety awareness;Follows commands  Safety/Judgement: Awareness of environment;Fall prevention;Good awareness of safety precautions;Insight into deficits  Functional Mobility Training:  Bed Mobility:  Rolling: CGA  Supine to Sit: Minimal assistance;Additional time;Verbal cues  Sit to Supine: Moderate assistance;Additional time;Verbal cues  Scooting: CGA;Verbal cues  Transfers:  Sit to Stand: Minimum assistance;Verbal cues  Stand to Sit: CGA;Verbal cues  Balance:  Sitting: Intact  Standing: Intact;With support  Ambulation/Gait Training:  Distance (ft): 30 Feet (ft)  Assistive Device: Brace/Splint;Gait belt;Walker, rolling  Ambulation - Level of Assistance: Minimal assistance  Gait Description (WDL): Exceptions to WDL  Gait Abnormalities: Antalgic;Step to gait  Base of Support: Widened  Stance: Weight shift  Speed/Cadence: Slow  Step Length: Right shortened;Left shortened  Swing Pattern: Left asymmetrical;Right asymmetrical  Interventions: Verbal cues;Safety awareness training    Pain:  Pain Scale 1: Numeric (0 - 10)  Pain Intensity 1: 3  Pain Location 1: Back  Pain Orientation 1: Lower  Pain Description 1: Aching  Pain Intervention(s) 1: Rest  Activity Tolerance:   Fair     After treatment:   [ ]  Patient left in no apparent distress sitting up in chair  [X]  Patient left in no apparent distress in bed  [X]  Call bell left within reach  [ ]  Nursing notified  [X]  Caregiver present  [ ]  Bed alarm activated      Westly Pam, PTA   Time Calculation: 29 mins

## 2012-11-05 NOTE — Progress Notes (Signed)
Bedside and Verbal shift change report given to Irene, RN (oncoming nurse) by Brittany Bagby, RN (offgoing nurse).  Report given with SBAR, MAR and Recent Results.

## 2012-11-05 NOTE — Progress Notes (Signed)
Progress Note      Patient: Marissa Harrell               Sex: female          DOA: 11/04/2012         Date of Birth:  Oct 03, 1928      Age:  78 y.o.        LOS:  LOS: 1 day               Subjective:   No acute changes overnight, patient has no complaints today    Objective:      Visit Vitals   Item Reading   ??? BP 128/71   ??? Pulse 73   ??? Temp 97.9 ??F (36.6 ??C)   ??? Resp 14   ??? Ht 5\' 1"  (1.549 m)   ??? Wt 39.945 kg (88 lb 1 oz)   ??? BMI 16.65 kg/m2   ??? SpO2 98%       Physical Exam:  Gen: Nad, Non-toxic.   Heent:  NCAT. Oropharynx wnl.   Neck: Supple no masses  Cor: S1S2 RRR  Resp: CTA B/L  Abd: Soft, NT, ND      Intake and Output:  Last three shifts:  11/18 1900 - 11/20 0659  In: 8109.2 [P.O.:1160; I.V.:6447.9]  Out: 2595 [Urine:2125; Drains:370]    Lab/Data Reviewed:  BMP:   Lab Results   Component Value Date/Time    NA 135* 11/05/2012  2:05 AM    K 3.5 11/05/2012  2:05 AM    CL 103 11/05/2012  2:05 AM    CO2 24 11/05/2012  2:05 AM    AGAP 8 11/05/2012  2:05 AM    GLU 96 11/05/2012  2:05 AM    BUN 14 11/05/2012  2:05 AM    CREA 0.89 11/05/2012  2:05 AM    GFRAA >60 11/05/2012  2:05 AM    GFRNA >60 11/05/2012  2:05 AM     CBC:   Lab Results   Component Value Date/Time    WBC 9.0 11/05/2012  2:05 AM    HGB 8.4* 11/05/2012  2:05 AM    HCT 24.6* 11/05/2012  2:05 AM    PLT 189 11/05/2012  2:05 AM       Medications Reviewed        Assessment/Plan     Active Problems:    DDD (degenerative disc disease), lumbar (10/30/2012)      HTN (hypertension) (11/05/2012)      Anemia, unspecified (11/05/2012)      Esophageal reflux (11/05/2012)        Plan:  Continue blood pressure control  Monitor hemoglobin  Supportive care

## 2012-11-05 NOTE — Progress Notes (Signed)
Pt is awake, alert, oriented x 3.  Pain level 3/10.  Does not need pain med at this time.  Pt complaining of nausea.  Medicated with Zofran 4 mg IV.  Pt voided 250 ml of clear yellow urine.

## 2012-11-05 NOTE — Progress Notes (Signed)
Progress Note POD #1      Patient: Marissa Harrell               Sex: female          DOA: 11/04/2012         Date of Birth:  Apr 29, 1928      Surgery: Procedure(s):  L3-5 DECOMPRESSION & FUSION W/C-ARM           LOS: 1 day               Subjective:     C/O nausea after using PCA. Difficulty moving in bed.    Objective:      Visit Vitals   Item Reading   ??? BP 107/52   ??? Pulse 77   ??? Temp 98.2 ??F (36.8 ??C)   ??? Resp 14   ??? Ht 5\' 1"  (1.549 m)   ??? Wt 39.945 kg (88 lb 1 oz)   ??? BMI 16.65 kg/m2   ??? SpO2 98%       Physical Exam:  Neurological: motor strength: 5/5 in lower extremities bilaterally                          sensation: intact to light touch  Patient mobility                         Intake and Output:  Current Shift:  11/20 0700 - 11/20 1859  In: 0.7   Out: -   Last three shifts:  11/18 1900 - 11/20 0659  In: 8109.2 [P.O.:1160; I.V.:6447.9]  Out: 2595 [Urine:2125; Drains:370]    Lab/Data Reviewed:    Lab/Data Reviewed:  Lab Results   Component Value Date/Time    WBC 9.0 11/05/2012  2:05 AM    HGB 8.4 11/05/2012  2:05 AM    HCT 24.6 11/05/2012  2:05 AM    PLATELET 189 11/05/2012  2:05 AM    MCV 93.5 11/05/2012  2:05 AM     Lab Results   Component Value Date/Time    aPTT 27.9 10/29/2012 11:15 AM     Lab Results   Component Value Date/Time    INR 1.1 10/29/2012 11:15 AM    INR 1.0 05/02/2010  4:30 PM    Prothrombin time 14.0 10/29/2012 11:15 AM    Prothrombin time 13.3 05/02/2010  4:30 PM            Assessment/Plan     Principal Problem:    Spinal stenosis, lumbar (10/30/2012)    Active Problems:    DDD (degenerative disc disease), lumbar (10/30/2012)        1. Stable  2. OOB with PT  3. D/C Planning- SNF  4. D/C PCA now  5. D/C foley      Dr Lisette Grinder has seen the patient today as well and agrees with above.

## 2012-11-05 NOTE — Progress Notes (Signed)
Received report, care assumed , patient laying in bed , daughter at bedside , daughter said patient has poor appetite and nausea, she received Zofran earlier  And now feels a little bit better .

## 2012-11-05 NOTE — Progress Notes (Signed)
Received pt in bed resting. Spa Team gave a complete bed bath, lotion, oil, message, and linen changed. While listening to soft music. Left pt in bed relaxed , with daughter at bedside.

## 2012-11-05 NOTE — Progress Notes (Signed)
Problem: Mobility Impaired (Adult and Pediatric)  Goal: *Acute Goals and Plan of Care (Insert Text)  Physical Therapy Goals  Initiated 11/05/2012 and to be accomplished within 1-4 day(s)  Bed mobility: Demonstrate proper log-roll technique for safe initiation of rolling for OOB activities.  Transfers:   Supine to sit to supine S with HR for meals.  Sit to stand to sit S with RW/LSO in prep for ambulation.  Gait: Ambulate 169ft S with RW/LSO, for home/community mobility.  Activity tolerance: Tolerate up in chair 30 minutes-1 hour for ADL???s.  Patient/Family Education: Patient/family to be independent with HEP for follow-up care and safe discharge.   PHYSICAL THERAPY EVALUATION    Patient: Marissa Harrell (78 y.o. female)  Date: 11/05/2012  Primary Diagnosis: LUMBAR SPONDYLOSIS, LUMBAGO,SCIATICA, SPINAL STENOSIS, HISTORY CARDIOVASCULAR DISEASE (CARDIAC STENTS)  Spinal stenosis, lumbar  Procedure(s) (LRB):  L3-5 DECOMPRESSION & FUSION W/C-ARM (N/A) 1 Day Post-Op   Precautions:   Fall;Spinal;Back      ASSESSMENT :  Based on the objective data described below, the patient presents to PT with functional mobility impairments with regard to bed mobility, transfers, gait, stair negotiation, balance, weakness, and overall tolerance for activity.  Pt was nauseated which limited her ability to participate in PT at this time.  Pt was min assist for bed mobility and functional transfers with moderate verbal cuing required.  During gait training pt ambulated with RW, with antalgic/step-to gait pattern.  Recommend SNF at time of discharge.    Patient will benefit from skilled intervention to address the above impairments.  Patient???s rehabilitation potential is considered to be Good  Factors which may influence rehabilitation potential include:   [ ]          None noted  [ ]          Mental ability/status  [X]          Medical condition  [X]          Home/family situation and support systems  [ ]          Safety awareness  [X]           Pain tolerance/management  [ ]          Other:        PLAN :  Recommendations and Planned Interventions:  [X]            Bed Mobility Training             [X]     Neuromuscular Re-Education  [X]            Transfer Training                   [ ]     Orthotic/Prosthetic Training  [X]            Gait Training                          [ ]     Modalities  [X]            Therapeutic Exercises          [ ]     Edema Management/Control  [X]            Therapeutic Activities            [X]     Patient and Family Training/Education  [ ]            Other (comment):    Frequency/Duration: Patient will be followed by physical therapy twice daily to address goals.  Discharge Recommendations: Skilled Nursing Facility  Further Equipment Recommendations for Discharge: bedside commode and rolling walker       SUBJECTIVE:   Patient stated ???I feel sick.???      OBJECTIVE DATA SUMMARY:       Past Medical History   Diagnosis Date   ??? Sjogren's syndrome     ??? Hypertension     ??? Heart attack     ??? Arthritis     ??? CAD (coronary artery disease)         s/p stents, EF 33%   ??? GERD (gastroesophageal reflux disease)         well controlled   ??? Psychiatric disorder         anxiety   ??? Nausea & vomiting     ??? Chronic kidney disease         renal artery stenosis     Past Surgical History   Procedure Laterality Date   ??? Pr cardiac surg procedure unlist           stent x 3   ??? Hx appendectomy       ??? Hx urological           bladder suspension   ??? Hx cataract removal           bilateral     Barriers to Learning/Limitations: no  Compensate with: visual, verbal, tactile, kinesthetic cues/model  Prior Level of Function/Home Situation: Independent with all ADLs, functional mobility, and ambulation without AD.   Home Situation  Home Environment: Private residence  # Steps to Enter: 3  One/Two Story Residence: One story  Living Alone: No  Support Systems: Family member(s)  Patient Expects to be Discharged to:: Skilled nursing facility  Current DME Used/Available at  Home: Walker;Cane, straight  Critical Behavior:  Neurologic State: Alert;Appropriate for age  Orientation Level: Oriented X4  Cognition: Appropriate decision making;Appropriate for age attention/concentration;Appropriate safety awareness;Follows commands  Safety/Judgement: Awareness of environment;Fall prevention;Good awareness of safety precautions;Insight into deficits  Psychosocial  Patient Behaviors: Calm;Cooperative  Family  Behaviors: Calm;Cooperative;Supportive  Skin Condition/Temp: Dry;Warm  Family  Behaviors: Calm;Cooperative;Supportive  Skin Integrity: Incision (comment) (no drainage noted)  Skin Integumentary  Skin Color: Appropriate for ethnicity  Skin Condition/Temp: Dry;Warm  Skin Integrity: Incision (comment) (no drainage noted)   Strength:    Strength: Within functional limits   Tone & Sensation:   Tone: Normal   Sensation: Intact   Range Of Motion:  AROM: Within functional limits   PROM: Within functional limits   Functional Mobility:  Bed Mobility:  Rolling: CGA  Supine to Sit: Moderate assistance;Verbal cues  Sit to Supine: Maximum assistance;Verbal cues  Scooting: CGA;Verbal cues  Transfers:  Sit to Stand: Minimum assistance;Verbal cues  Stand to Sit: CGA;Verbal cues    Balance:   Sitting: Intact  Standing: Intact;With support  Ambulation/Gait Training:  Distance (ft): 4 Feet (ft)  Assistive Device: Walker, rolling;Brace/Splint;Gait belt  Ambulation - Level of Assistance: Minimal assistance   Gait Description (WDL): Exceptions to WDL  Gait Abnormalities: Antalgic;Step to gait   Base of Support: Widened  Stance: Weight shift  Speed/Cadence: Slow  Step Length: Right shortened;Left shortened  Swing Pattern: Right asymmetrical;Left asymmetrical   Interventions: Verbal cues;Tactile cues;Safety awareness training   Therapeutic Exercises:   HEP packet given and explained to pt  Pain:  Pain Scale 1: Numeric (0 - 10)  Pain Intensity 1: 6  Pain Location 1: Back  Pain Orientation 1: Lower  Pain Description  1: Aching  Pain Intervention(s) 1: Medication (see MAR)  Activity Tolerance:   Fair  Please refer to the flowsheet for vital signs taken during this treatment.  After treatment:   [ ]          Patient left in no apparent distress sitting up in chair  [X]          Patient left in no apparent distress in bed  [X]          Call bell left within reach  [X]          Nursing notified  [ ]          Caregiver present  [ ]          Bed alarm activated      COMMUNICATION/EDUCATION:   [X]          Fall prevention education was provided and the patient/caregiver indicated understanding.  [X]          Patient/family have participated as able in goal setting and plan of care.  [X]          Patient/family agree to work toward stated goals and plan of care.  [ ]          Patient understands intent and goals of therapy, but is neutral about his/her participation.  [ ]          Patient is unable to participate in goal setting and plan of care.    Thank you for this referral.    Leroy Libman, DPT    Time Calculation: 20 mins

## 2012-11-05 NOTE — Progress Notes (Signed)
Problem: Self Care Deficits Care Plan (Adult)  Goal: *Acute Goals and Plan of Care (Insert Text)  Occupational Therapy Goals  Initiated 11/05/2012 within 3 day(s).    1. Patient will perform grooming and bathing with supervision/set-up   2. Patient will perform upper body dressing with supervision/set-up.  3. Patient will perform lower body dressing with minimal assistance/contact guard assist.  4. Patient will perform toilet transfers with minimal assistance/contact guard assist.  5. Patient will perform all aspects of toileting with minimal assistance/contact guard assist.  6. Patient will participate in upper extremity therapeutic exercise/activities with independence for 10 minutes.   7. Patient will utilize energy conservation techniques during functional activities with verbal cues.  Outcome: Progressing Towards Goal  OCCUPATIONAL THERAPY EVALUATION    Patient: Marissa Harrell (78 y.o. female)  Date: 11/05/2012  Primary Diagnosis: LUMBAR SPONDYLOSIS, LUMBAGO,SCIATICA, SPINAL STENOSIS, HISTORY CARDIOVASCULAR DISEASE (CARDIAC STENTS)  Spinal stenosis, lumbar  Procedure(s) (LRB):  L3-5 DECOMPRESSION & FUSION W/C-ARM (N/A) 1 Day Post-Op   Precautions:  Fall;Spinal;Back      ASSESSMENT :  Based on the objective data described below, the patient presents with decreased ability to perform functional mobility, bed mobility, LE dressing and functional transfers.  Pt limited by nausea, decreased activity tolerance, and severe pain during activity.    Patient will benefit from skilled intervention to address the above impairments.  Patient???s rehabilitation potential is considered to be Good  Factors which may influence rehabilitation potential include:   [ ]              None noted  [ ]              Mental ability/status  [ ]              Medical condition  [ ]              Home/family situation and support systems  [X]              Safety awareness  [X]              Pain tolerance/management  [ ]              Other:         PLAN :  Recommendations and Planned Interventions:  [X]                Self Care Training                  [X]         Therapeutic Activities  [X]                Functional Mobility Training    [ ]         Cognitive Retraining  [X]                Therapeutic Exercises           [X]         Endurance Activities  [X]                Balance Training                   [ ]         Neuromuscular Re-Education  [ ]                Visual/Perceptual Training     [X]    Home Safety Training  [X]                Patient Education                 [ ]   Family Training/Education  [ ]                Other (comment):    Frequency/Duration: Patient will be followed by occupational therapy 1-2 times per day/4-7 days per week to address goals.  Discharge Recommendations: SNF  Further Equipment Recommendations for Discharge: bedside commode, shower chair and rolling walker       SUBJECTIVE:   Patient stated ???I am doing ok, just pain.???      OBJECTIVE DATA SUMMARY:       Past Medical History   Diagnosis Date   ??? Sjogren's syndrome     ??? Hypertension     ??? Heart attack     ??? Arthritis     ??? CAD (coronary artery disease)         s/p stents, EF 33%   ??? GERD (gastroesophageal reflux disease)         well controlled   ??? Psychiatric disorder         anxiety   ??? Nausea & vomiting     ??? Chronic kidney disease         renal artery stenosis     Past Surgical History   Procedure Laterality Date   ??? Pr cardiac surg procedure unlist           stent x 3   ??? Hx appendectomy       ??? Hx urological           bladder suspension   ??? Hx cataract removal           bilateral     Barriers to Learning/Limitations: no  Compensate with: visual, verbal, tactile, kinesthetic cues/model  Prior Level of Function/Home Situation: Independent with ADL's  Home Situation  Home Environment: Private residence  # Steps to Enter: 3  One/Two Story Residence: One story  Living Alone: No  Support Systems: Family member(s)  Patient Expects to be Discharged to:: Skilled nursing facility   Current DME Used/Available at Home: Walker;Cane, straight  [X]   Right hand dominant          [ ]   Left hand dominant  Cognitive/Behavioral Status:  Neurologic State: Alert;Appropriate for age  Orientation Level: Oriented X4  Cognition: Appropriate decision making;Appropriate for age attention/concentration;Appropriate safety awareness;Follows commands  Safety/Judgement: Awareness of environment;Fall prevention;Good awareness of safety precautions;Insight into deficits  Vision/Perceptual:    Acuity: Within Defined Limits    Corrective Lenses: Glasses  Coordination:  Fine Motor Skills-Upper: Left Intact;Right Intact    Gross Motor Skills-Upper: Left Intact;Right Intact  Balance:     Strength:        Tone & Sensation:        Range of Motion:        Functional Mobility and Transfers for ADLs:  Bed Mobility:              Transfers:                                                                                         ADL Assessment:  Feeding: Independent  Oral Facial Hygiene/Grooming: Minimum assistance  Bathing: Moderate assistance  Upper  Body Dressing: Moderate assistance  Lower Body Dressing: Maximum assistance    Cognitive Retraining  Safety/Judgement: Awareness of environment;Fall prevention;Good awareness of safety precautions;Insight into deficits  Pain:  Pain Scale 1: Numeric (0 - 10)  Pain Intensity 1: 6  Pain Location 1: Back  Pain Orientation 1: Lower  Pain Description 1: Aching  Pain Intervention(s) 1: Medication (see MAR)  Activity Tolerance:   Fair  Please refer to the flowsheet for vital signs taken during this treatment.  After treatment:   [ ]  Patient left in no apparent distress sitting up in chair  [X]  Patient left in no apparent distress in bed  [X]  Call bell left within reach  [X]  Nursing notified  [ ]  Caregiver present  [ ]  Bed alarm activated      COMMUNICATION/EDUCATION:   [X]  Home safety education was provided and the patient/caregiver indicated understanding.  [X]  Patient/family have  participated as able in goal setting and plan of care.  [X]  Patient/family agree to work toward stated goals and plan of care.  [ ]  Patient understands intent and goals of therapy, but is neutral about his/her participation.  [ ]  Patient is unable to participate in goal setting and plan of care.    Thank you for this referral.  Maryfrances Bunnell, OTR/L  Time Calculation: 19 mins

## 2012-11-05 NOTE — Progress Notes (Signed)
Pt ambulated few steps in the room.  Complained of Nausea.  Medicated with Zofran 4 mg IV.  Foley cath removed.

## 2012-11-05 NOTE — Progress Notes (Signed)
Received message that MD office had arranged home health with [facility].  Chart reviewed, noted patient with back surgery.  Met with patient and her dtr April Shrieves, visiting from [place], [place].  April reported she had discussed post hosp plans with Dr Lisette Grinder who recommended temp rehab.  List of area SNF provided, FOC completed for  (1) [facility] and for (2) [facility].  Referral placed to [facility].  Per response from [facility], [address], [place], [place] [id number], they can accept pt on Friday, 11-07-12.  Met with pt and her dtr who were agreeable to going to [facility] on Friday; provided them with computer info on facility; pt's dtr reported she would provide transportation.  Pt will need her Discharge Summary, Med Rec, and scripts for controlled meds to accompany her.  Pt's nurse to call report to [phone].    Plan: on Friday, 11-07-12 go to [facility], [address], [place], [place] [address] via dtr Annice Needy

## 2012-11-05 NOTE — Progress Notes (Signed)
Pt ambulated with PT to outside of room and BR.

## 2012-11-05 NOTE — Progress Notes (Signed)
Pt woke up on rounds.  Pain level  3/10 and more comfortable.

## 2012-11-06 LAB — CBC W/O DIFF
HCT: 23.4 % — ABNORMAL LOW (ref 35.0–45.0)
HGB: 8.2 g/dL — ABNORMAL LOW (ref 12.0–16.0)
MCH: 32.7 PG (ref 24.0–34.0)
MCHC: 35 g/dL (ref 31.0–37.0)
MCV: 93.2 FL (ref 74.0–97.0)
MPV: 9.9 FL (ref 9.2–11.8)
PLATELET: 166 10*3/uL (ref 135–420)
RBC: 2.51 M/uL — ABNORMAL LOW (ref 4.20–5.30)
RDW: 13.6 % (ref 11.6–14.5)
WBC: 9.8 10*3/uL (ref 4.6–13.2)

## 2012-11-06 MED ADMIN — sodium chloride (NS) flush 5-10 mL: INTRAVENOUS | @ 03:00:00 | NDC 87701099893

## 2012-11-06 MED ADMIN — telmisartan (MICARDIS) tablet 20 mg: ORAL | @ 14:00:00 | NDC 00597004037

## 2012-11-06 MED ADMIN — carvedilol (COREG) tablet 6.25 mg: ORAL | @ 14:00:00 | NDC 55111025201

## 2012-11-06 MED ADMIN — hydroxychloroquine (PLAQUENIL) tablet 200 mg: ORAL | @ 14:00:00 | NDC 68084026911

## 2012-11-06 MED ADMIN — acetaminophen (TYLENOL) tablet 650 mg: ORAL | @ 08:00:00 | NDC 00904198261

## 2012-11-06 MED ADMIN — acetaminophen (TYLENOL) tablet 650 mg: ORAL | @ 20:00:00 | NDC 00904198261

## 2012-11-06 MED ADMIN — sodium chloride (NS) flush 5-10 mL: INTRAVENOUS | @ 12:00:00 | NDC 87701099893

## 2012-11-06 MED ADMIN — ondansetron (ZOFRAN) injection 4 mg: INTRAVENOUS | @ 15:00:00 | NDC 00409475503

## 2012-11-06 MED ADMIN — cycloSPORINE (RESTASIS) 0.05 % ophthalmic emulsion 1 Drop: OPHTHALMIC | @ 23:00:00 | NDC 00023916330

## 2012-11-06 MED ADMIN — cycloSPORINE (RESTASIS) 0.05 % ophthalmic emulsion 1 Drop: OPHTHALMIC | @ 14:00:00 | NDC 00023916330

## 2012-11-06 MED ADMIN — traMADol (ULTRAM) tablet 50-100 mg: ORAL | @ 14:00:00 | NDC 62584055911

## 2012-11-06 MED ADMIN — docusate sodium (COLACE) capsule 100 mg: ORAL | @ 14:00:00 | NDC 62584068311

## 2012-11-06 MED ADMIN — ezetimibe (ZETIA) tablet 10 mg: ORAL | @ 03:00:00 | NDC 66582041429

## 2012-11-06 MED ADMIN — sodium chloride (NS) flush 5-10 mL: INTRAVENOUS | @ 15:00:00 | NDC 87701099893

## 2012-11-06 MED ADMIN — sodium chloride (NS) flush 5-10 mL: INTRAVENOUS | @ 20:00:00 | NDC 87701099893

## 2012-11-06 MED ADMIN — magnesium oxide (MAG-OX) tablet 200 mg: ORAL | @ 14:00:00 | NDC 63739035410

## 2012-11-06 MED ADMIN — simvastatin (ZOCOR) tablet 40 mg: ORAL | @ 03:00:00 | NDC 68084051211

## 2012-11-06 MED FILL — MAPAP (ACETAMINOPHEN) 325 MG TABLET: 325 mg | ORAL | Qty: 2

## 2012-11-06 MED FILL — RESTASIS 0.05 % EYE DROPS IN A DROPPERETTE: 0.05 % | OPHTHALMIC | Qty: 1

## 2012-11-06 MED FILL — HYDROXYCHLOROQUINE 200 MG TAB: 200 mg | ORAL | Qty: 1

## 2012-11-06 MED FILL — CARVEDILOL 3.125 MG TAB: 3.125 mg | ORAL | Qty: 2

## 2012-11-06 MED FILL — MAGNESIUM OXIDE 400 MG TAB: 400 mg | ORAL | Qty: 1

## 2012-11-06 MED FILL — MICARDIS 40 MG TABLET: 40 mg | ORAL | Qty: 1

## 2012-11-06 MED FILL — TRAMADOL 50 MG TAB: 50 mg | ORAL | Qty: 2

## 2012-11-06 MED FILL — ZETIA 10 MG TABLET: 10 mg | ORAL | Qty: 1

## 2012-11-06 MED FILL — SIMVASTATIN 20 MG TAB: 20 mg | ORAL | Qty: 2

## 2012-11-06 MED FILL — DOCUSATE SODIUM 100 MG CAP: 100 mg | ORAL | Qty: 1

## 2012-11-06 NOTE — Progress Notes (Signed)
Provided pt with bath wipes for personal hygiene. Pt states she does not need assistance, instructed pt to call if she needs help with anything. Pt stated understanding. Family is at the bedside assisting the pt. Lianne Bushy, CNA

## 2012-11-06 NOTE — Progress Notes (Signed)
Problem: Mobility Impaired (Adult and Pediatric)  Goal: *Acute Goals and Plan of Care (Insert Text)  Physical Therapy Goals  Initiated 11/05/2012 and to be accomplished within 1-4 day(s)  Bed mobility: Demonstrate proper log-roll technique for safe initiation of rolling for OOB activities.  Transfers:   Supine to sit to supine S with HR for meals.  Sit to stand to sit S with RW/LSO in prep for ambulation.  Gait: Ambulate 170ft S with RW/LSO, for home/community mobility.  Activity tolerance: Tolerate up in chair 30 minutes-1 hour for ADL???s.  Patient/Family Education: Patient/family to be independent with HEP for follow-up care and safe discharge.   Outcome: Progressing Towards Goal  PHYSICAL THERAPY TREATMENT    Patient: Marissa Harrell (78 y.o. female)  Date: 11/06/2012  Diagnosis: LUMBAR SPONDYLOSIS, LUMBAGO,SCIATICA, SPINAL STENOSIS, HISTORY CARDIOVASCULAR DISEASE (CARDIAC STENTS)  Spinal stenosis, lumbar Spinal stenosis, lumbar  Procedure(s) (LRB):  L3-5 DECOMPRESSION & FUSION W/C-ARM (N/A) 2 Days Post-Op  Precautions: Fall;Spinal;Back  Chart, physical therapy assessment, plan of care and goals were reviewed.      ASSESSMENT:  Pt has severe nausea and vomiting throughout session, limiting progress at this time.  Progression toward goals:  [ ]       Improving appropriately and progressing toward goals  [X]       Improving slowly and progressing toward goals  [ ]       Not making progress toward goals and plan of care will be adjusted       PLAN:  Patient continues to benefit from skilled intervention to address the above impairments. Continue treatment per established plan of care.  Discharge Recommendations:  Skilled Nursing Facility  Further Equipment Recommendations for Discharge:  bedside commode and rolling walker       SUBJECTIVE:   Patient stated ???I hurt when I move.???      OBJECTIVE DATA SUMMARY:   Critical Behavior:  Neurologic State: Alert  Orientation Level: Oriented X4  Cognition: Appropriate for age  attention/concentration;Appropriate safety awareness;Follows commands  Safety/Judgement: Awareness of environment;Fall prevention;Good awareness of safety precautions;Insight into deficits  Functional Mobility Training:  Bed Mobility:  Rolling: CGA  Supine to Sit: CGA  Sit to Supine: Minimum assistance  Scooting: CGA  Transfers:  Sit to Stand: CGA  Stand to Sit: CGA  Balance:  Sitting: Intact  Standing: Intact;With support  Ambulation/Gait Training:  Distance (ft): 30 Feet (ft)  Assistive Device: Brace/Splint;Gait belt;Walker, rolling  Ambulation - Level of Assistance: CGA  Gait Abnormalities: Antalgic;Step to gait  Base of Support: Widened  Stance: Weight shift  Speed/Cadence: Slow  Step Length: Right shortened;Left shortened  Interventions: Verbal cues;Safety awareness training  Pain:  Pain Scale 1: Numeric (0 - 10)  Pain Intensity 1: 3  Pain Location 1: Back  Pain Orientation 1: Lower  Pain Description 1: Aching  Pain Intervention(s) 1: Medication (see MAR)  Activity Tolerance:   Fair  Please refer to the flowsheet for vital signs taken during this treatment.  After treatment:   [ ]  Patient left in no apparent distress sitting up in chair  [X]  Patient left in no apparent distress in bed  [X]  Call bell left within reach  [X]  Nursing notified  [ ]  Caregiver present  [ ]  Bed alarm activated      Benay Spice, PTA   Time Calculation: 29 mins

## 2012-11-06 NOTE — Progress Notes (Signed)
Bedside shift change report given to S Thomas RN (oncoming nurse) by T samaheen RN (offgoing nurse).  Report given with SBAR and Kardex.

## 2012-11-06 NOTE — Progress Notes (Signed)
Bedside and Verbal shift change report given to Taher RN (oncoming nurse) by S.Thomas RN (offgoing nurse).  Report given with SBAR, Kardex and MAR.

## 2012-11-06 NOTE — Progress Notes (Signed)
Problem: Mobility Impaired (Adult and Pediatric)  Goal: *Acute Goals and Plan of Care (Insert Text)  Physical Therapy Goals  Initiated 11/05/2012 and to be accomplished within 1-4 day(s)  Bed mobility: Demonstrate proper log-roll technique for safe initiation of rolling for OOB activities.  Transfers:   Supine to sit to supine S with HR for meals.  Sit to stand to sit S with RW/LSO in prep for ambulation.  Gait: Ambulate 131ft S with RW/LSO, for home/community mobility.  Activity tolerance: Tolerate up in chair 30 minutes-1 hour for ADL???s.  Patient/Family Education: Patient/family to be independent with HEP for follow-up care and safe discharge.   Outcome: Progressing Towards Goal  PHYSICAL THERAPY TREATMENT    Patient: Marissa Harrell (78 y.o. female)  Date: 11/06/2012  Diagnosis: LUMBAR SPONDYLOSIS, LUMBAGO,SCIATICA, SPINAL STENOSIS, HISTORY CARDIOVASCULAR DISEASE (CARDIAC STENTS)  Spinal stenosis, lumbar Spinal stenosis, lumbar  Procedure(s) (LRB):  L3-5 DECOMPRESSION & FUSION W/C-ARM (N/A) 2 Days Post-Op  Precautions: Fall;Spinal;Back  Chart, physical therapy assessment, plan of care and goals were reviewed.      ASSESSMENT:  Pt much more capable during amb this PM and is safe for restroom TFs with Daughter.   Progression toward goals:  [ ]       Improving appropriately and progressing toward goals  [X]       Improving slowly and progressing toward goals  [ ]       Not making progress toward goals and plan of care will be adjusted       PLAN:  Patient continues to benefit from skilled intervention to address the above impairments. Continue treatment per established plan of care.  Discharge Recommendations:  Skilled Nursing Facility  Further Equipment Recommendations for Discharge:  bedside commode and rolling walker       SUBJECTIVE:   Patient stated ???I'm doing better now.???      OBJECTIVE DATA SUMMARY:   Critical Behavior:  Neurologic State: Alert  Orientation Level: Oriented X4  Cognition: Appropriate for age  attention/concentration;Appropriate safety awareness;Follows commands  Safety/Judgement: Awareness of environment;Fall prevention;Good awareness of safety precautions;Insight into deficits  Functional Mobility Training:  Bed Mobility:  Rolling: CGA  Supine to Sit: CGA  Sit to Supine: Minimum assistance  Scooting: CGA  Transfers:  Sit to Stand: Supervision  Stand to Sit: Supervision  Balance:  Sitting: Intact  Standing: Intact;With support  Ambulation/Gait Training:  Distance (ft): 150 Feet (ft)  Assistive Device: Brace/Splint;Gait belt;Walker, rolling  Ambulation - Level of Assistance: SBA;CGA  Gait Abnormalities: Antalgic  Base of Support: Widened  Stance: Weight shift  Speed/Cadence: Slow  Step Length: Right shortened;Left shortened  Interventions: Verbal cues  Pain:  Pain Scale 1: Numeric (0 - 10)  Pain Intensity 1: 2  Pain Location 1: Incisional;Back  Pain Orientation 1: Lower  Pain Description 1: Aching;Sore  Pain Intervention(s) 1: Medication (see MAR)  Activity Tolerance:   Good  Please refer to the flowsheet for vital signs taken during this treatment.  After treatment:   [X]  Patient left in no apparent distress sitting EOB  [ ]  Patient left in no apparent distress in bed  [X]  Call bell left within reach  [X]  Nursing notified  [X]  Caregiver present  [ ]  Bed alarm activated      Benay Spice, PTA   Time Calculation: 30 mins

## 2012-11-06 NOTE — Progress Notes (Addendum)
Assisted patient from bathroom to bed , Hemovac  Drain has air leak , took dressing off , tube was pulled out  From insertion site , found  10 ml serosanguineous  Fluid  In the drain  , removed old dressing , staples CDI, cleaned with safe dermal cleanser  , covered with mepilex and sterile 4 x4 on drain site, secured with  tape

## 2012-11-06 NOTE — Progress Notes (Signed)
Assumed patient care, patient laying in bed , daughter at bedside , denies pain while resting.

## 2012-11-06 NOTE — Progress Notes (Signed)
Vomited , medicated for nausea with zofran iv , states "cant take the pain medication except tylenol.

## 2012-11-06 NOTE — Progress Notes (Signed)
Progress Note POD #2      Patient: Marissa Harrell               Sex: female          DOA: 11/04/2012         Date of Birth:  Jul 18, 1928      Surgery: Procedure(s):  L3-5 DECOMPRESSION & FUSION W/C-ARM           LOS: 2 days               Subjective:     No new complaints. States tylenol helps but needs something stronger. Dilaudid causes nausea.    Objective:      Visit Vitals   Item Reading   ??? BP 132/64   ??? Pulse 85   ??? Temp 99.3 ??F (37.4 ??C)   ??? Resp 14   ??? Ht 5\' 1"  (1.549 m)   ??? Wt 39.945 kg (88 lb 1 oz)   ??? BMI 16.65 kg/m2   ??? SpO2 97%       Physical Exam:  Neurological: motor strength: 5/5 in lower extremities bilaterally                          sensation: intact to light touch  Patient mobility  Bed Mobility Training  Rolling: CGA  Supine to Sit: Minimal assistance;Additional time;Verbal cues  Sit to Supine: Moderate assistance;Additional time;Verbal cues  Scooting: CGA;Verbal cues  Transfer Training  Sit to Stand: Minimum assistance;Verbal cues  Stand to Sit: CGA;Verbal cues      Gait Training  Assistive Device: Brace/Splint;Gait belt;Walker, rolling  Ambulation - Level of Assistance: Minimal assistance  Distance (ft): 30 Feet (ft)  Interventions: Verbal cues;Safety awareness training       Patient Response  Patient Response: Fair     Intake and Output:  Current Shift:     Last three shifts:  11/19 1900 - 11/21 0659  In: 402 [P.O.:400]  Out: 1800 [Urine:1600; Drains:200]    Lab/Data Reviewed:    Lab/Data Reviewed:  Lab Results   Component Value Date/Time    WBC 9.8 11/06/2012  4:18 AM    HGB 8.2 11/06/2012  4:18 AM    HCT 23.4 11/06/2012  4:18 AM    PLATELET 166 11/06/2012  4:18 AM    MCV 93.2 11/06/2012  4:18 AM     Lab Results   Component Value Date/Time    aPTT 27.9 10/29/2012 11:15 AM     Lab Results   Component Value Date/Time    INR 1.1 10/29/2012 11:15 AM    INR 1.0 05/02/2010  4:30 PM    Prothrombin time 14.0 10/29/2012 11:15 AM    Prothrombin time 13.3 05/02/2010  4:30 PM            Assessment/Plan      Active Problems:    DDD (degenerative disc disease), lumbar (10/30/2012)      HTN (hypertension) (11/05/2012)      Anemia, unspecified (11/05/2012)      Esophageal reflux (11/05/2012)        1. Stable  2. OOB with PT  3. D/C Planning-  [facility] tomorrow.  4. D/C drain  5. Change dressing  6. Ultram prn    Dr Lisette Grinder has seen the patient today as well and agrees with above.

## 2012-11-06 NOTE — Progress Notes (Signed)
Ambulatory in hallway with walker and physical therapy, tolerated well.

## 2012-11-06 NOTE — Progress Notes (Signed)
NUTRITION    ASSESSMENT:     Diet:  Regular  PO Intake:  []    Good        [x]    Fair        [x]    Poor        []    N/A  Food Allergies:  NKFA  Skin:   [x]    Reviewed & with surgical incision;  Medications:  [x]    Reviewed & include IVF (LR) and Zocor;  Labs:    Lab Results   Component Value Date/Time    Sodium 135 11/05/2012  2:05 AM    Potassium 3.5 11/05/2012  2:05 AM    Chloride 103 11/05/2012  2:05 AM    CO2 24 11/05/2012  2:05 AM    Anion gap 8 11/05/2012  2:05 AM    Glucose 96 11/05/2012  2:05 AM    BUN 14 11/05/2012  2:05 AM    Creatinine 0.89 11/05/2012  2:05 AM    Calcium 7.5 11/05/2012  2:05 AM    Albumin 3.9 05/02/2010  4:30 PM       Anthropometrics:   IBW:  42.73 kg (94 llb)   % IBW (Calculated):  91%  BMI (calculated): 16.4    Wt Readings from Last 1 Encounters:   11/06/12 39.345 kg (86 lb 11.8 oz)     Ht Readings from Last 1 Encounters:   11/06/12 5\' 1"  (1.549 m)       Estimated Nutrition Needs:  Calories (kcal): 1274 Kcals/day (MSJ, PAL (1.3) + 250-500 = 1274-1524 kcal/d)  Protein (g): 39 g (1-1.2 g/kg = 39-47 g/d)  Fluid (ml): 1500 ml (minimum of 1500 ml/d)    Based on:  [x]    Actual Wt        []    IBW      [facility]aden Screening Referral received noting non-adherence to prescribed diet and patient request referral and persistent n/v/d.  Pt's po intake per Braden Nutrition Subscale noted as probably inadequate.  BMI indicative of underweight d/w patient who tells she was eating 2 light meals/day and frequently went out for dinner.  She has been an avid walker per H&P, however has not been in the past month or so r/t back pain.  RT. with DX. Sjogren's Syndrome which may have a negative affect on her po intake.  Since admission she has had nausea too which is decreasing her po intake.  Patient may benefit from a supplement, i.e. Ensure and will add Magic Cup to her lunch & supper.  Diet education is not indicated.    DIAGNOSIS:   Underweight (NC-3.1) R/T inadequate energy intake & excessive physical  activity &  Sj??gren???s syndrome AEB BMI <18.5;    INTERVENTIONS:   1. Continue Regular diet select DOC  2. Suggest supplement of Ensure BID  3. Offer Magic Cup BID with lunch & dinner  4. Encourage po intake    MONITORING/EVALUATION:   1. Monitor po intake  2. Monitor acceptance of Ensure and Magic Cup once provided   3. Continue inpatient monitoring   4. Evaluate per Nutrition Risk protocol      Nutrition Risk:  []    High        [x]    Moderate        []    Minimal/Uncompromised

## 2012-11-06 NOTE — Progress Notes (Signed)
Progress Note      Patient: Marissa Harrell               Sex: female          DOA: 11/04/2012       Date of Birth:  11-11-1928      Age:  78 y.o.        LOS:  LOS: 2 days               Subjective:     No distress.  No chest pain or shortness of breath.    Objective:      Visit Vitals   Item Reading   ??? BP 132/56   ??? Pulse 83   ??? Temp 98.3 ??F (36.8 ??C)   ??? Resp 16   ??? Ht 5\' 1"  (1.549 m)   ??? Wt 39.945 kg (88 lb 1 oz)   ??? BMI 16.65 kg/m2   ??? SpO2 100%       Physical Exam:  Gen:  No distress, no complaint  Lungs:  Clear bilaterally, no wheeze or rhonchi  Heart:  Regular rate and rhythm, no murmurs or gallops  Abdomen:  Soft, non-tender, normal bowel sounds        Lab/Data Reviewed:    CBC:   Lab Results   Component Value Date/Time    WBC 9.8 11/06/2012  4:18 AM    HGB 8.2* 11/06/2012  4:18 AM    HCT 23.4* 11/06/2012  4:18 AM    PLT 166 11/06/2012  4:18 AM           Assessment/Plan     Active Problems:    DDD (degenerative disc disease), lumbar (10/30/2012)      HTN (hypertension) (11/05/2012)      Anemia, unspecified (11/05/2012)      Esophageal reflux (11/05/2012)        Plan:  Continue with pain control  Mobilization per orthopedics.  No evidence for chest pain currently.

## 2012-11-07 LAB — CBC W/O DIFF
HCT: 22.1 % — ABNORMAL LOW (ref 35.0–45.0)
HGB: 7.6 g/dL — ABNORMAL LOW (ref 12.0–16.0)
MCH: 32.2 PG (ref 24.0–34.0)
MCHC: 34.4 g/dL (ref 31.0–37.0)
MCV: 93.6 FL (ref 74.0–97.0)
MPV: 10.3 FL (ref 9.2–11.8)
PLATELET: 156 10*3/uL (ref 135–420)
RBC: 2.36 M/uL — ABNORMAL LOW (ref 4.20–5.30)
RDW: 13.7 % (ref 11.6–14.5)
WBC: 8.7 10*3/uL (ref 4.6–13.2)

## 2012-11-07 MED ADMIN — carvedilol (COREG) tablet 6.25 mg: ORAL | @ 15:00:00 | NDC 68084026111

## 2012-11-07 MED ADMIN — traMADol (ULTRAM) tablet 50-100 mg: ORAL | @ 02:00:00 | NDC 62584055911

## 2012-11-07 MED ADMIN — sodium chloride (NS) flush 5-10 mL: INTRAVENOUS | @ 02:00:00 | NDC 87701099893

## 2012-11-07 MED ADMIN — acetaminophen (TYLENOL) tablet 650 mg: ORAL | @ 21:00:00 | NDC 00904198261

## 2012-11-07 MED ADMIN — acetaminophen (TYLENOL) tablet 650 mg: ORAL | @ 16:00:00 | NDC 00904198261

## 2012-11-07 MED ADMIN — carvedilol (COREG) tablet 6.25 mg: ORAL | NDC 68084026111

## 2012-11-07 MED ADMIN — simvastatin (ZOCOR) tablet 40 mg: ORAL | @ 02:00:00 | NDC 68084051211

## 2012-11-07 MED ADMIN — docusate sodium (COLACE) capsule 100 mg: ORAL | @ 15:00:00 | NDC 62584068311

## 2012-11-07 MED ADMIN — ezetimibe (ZETIA) tablet 10 mg: ORAL | @ 02:00:00 | NDC 66582041429

## 2012-11-07 MED ADMIN — docusate sodium (COLACE) capsule 100 mg: ORAL | NDC 62584068311

## 2012-11-07 MED ADMIN — sodium chloride (NS) flush 5-10 mL: INTRAVENOUS | @ 12:00:00 | NDC 87701099893

## 2012-11-07 MED ADMIN — magnesium oxide (MAG-OX) tablet 200 mg: ORAL | @ 15:00:00 | NDC 63739035410

## 2012-11-07 MED ADMIN — cycloSPORINE (RESTASIS) 0.05 % ophthalmic emulsion 1 Drop: OPHTHALMIC | @ 14:00:00 | NDC 00023916330

## 2012-11-07 MED ADMIN — hydroxychloroquine (PLAQUENIL) tablet 200 mg: ORAL | @ 15:00:00 | NDC 68084026911

## 2012-11-07 MED ADMIN — telmisartan (MICARDIS) tablet 20 mg: ORAL | @ 15:00:00 | NDC 00597004037

## 2012-11-07 MED FILL — CARVEDILOL 3.125 MG TAB: 3.125 mg | ORAL | Qty: 2

## 2012-11-07 MED FILL — TRAMADOL 50 MG TAB: 50 mg | ORAL | Qty: 1

## 2012-11-07 MED FILL — MAPAP (ACETAMINOPHEN) 325 MG TABLET: 325 mg | ORAL | Qty: 2

## 2012-11-07 MED FILL — SIMVASTATIN 20 MG TAB: 20 mg | ORAL | Qty: 2

## 2012-11-07 MED FILL — ZETIA 10 MG TABLET: 10 mg | ORAL | Qty: 1

## 2012-11-07 MED FILL — RESTASIS 0.05 % EYE DROPS IN A DROPPERETTE: 0.05 % | OPHTHALMIC | Qty: 1

## 2012-11-07 MED FILL — SODIUM CHLORIDE 0.9 % IV: INTRAVENOUS | Qty: 250

## 2012-11-07 MED FILL — HYDROXYCHLOROQUINE 200 MG TAB: 200 mg | ORAL | Qty: 1

## 2012-11-07 MED FILL — MICARDIS 40 MG TABLET: 40 mg | ORAL | Qty: 1

## 2012-11-07 MED FILL — DOCUSATE SODIUM 100 MG CAP: 100 mg | ORAL | Qty: 1

## 2012-11-07 MED FILL — MAGNESIUM OXIDE 400 MG TAB: 400 mg | ORAL | Qty: 1

## 2012-11-07 NOTE — Progress Notes (Signed)
Problem: Self Care Deficits Care Plan (Adult)  Goal: *Acute Goals and Plan of Care (Insert Text)  Occupational Therapy Goals  Initiated 11/05/2012 within 3 day(s).    1. Patient will perform grooming and bathing with supervision/set-up   2. Patient will perform upper body dressing with supervision/set-up.  3. Patient will perform lower body dressing with minimal assistance/contact guard assist.  4. Patient will perform toilet transfers with minimal assistance/contact guard assist.  5. Patient will perform all aspects of toileting with minimal assistance/contact guard assist.  6. Patient will participate in upper extremity therapeutic exercise/activities with independence for 10 minutes.   7. Patient will utilize energy conservation techniques during functional activities with verbal cues.   Outcome: Resolved/Met Date Met:  11/07/12  OCCUPATIONAL THERAPY TREATMENT/DISCHARGE    Patient: Marissa Harrell (78 y.o. female)  Date: 11/07/2012  Diagnosis: LUMBAR SPONDYLOSIS, LUMBAGO,SCIATICA, SPINAL STENOSIS, HISTORY CARDIOVASCULAR DISEASE (CARDIAC STENTS)  Spinal stenosis, lumbar Spinal stenosis, lumbar  Procedure(s) (LRB):  L3-5 DECOMPRESSION & FUSION W/C-ARM (N/A) 3 Days Post-Op  Precautions: Fall;Spinal;Back  Chart, occupational therapy assessment, plan of care, and goals were reviewed.      ASSESSMENT:  Pt has met goals for d/c from acute OT.          PLAN:  Patient will be discharged from occupational therapy at this time.  Rationale for discharge:  [X]  Goals Achieved  [ ]  Plateau Reached  [ ]  Patient not participating in therapy  [ ]  Other:  Discharge Recommendations:  Home Health  Further Equipment Recommendations for Discharge:  N/A       SUBJECTIVE:   Patient stated ???I want to learn how to do that.???      OBJECTIVE DATA SUMMARY:   Cognitive/Behavioral Status:  Neurologic State: Alert;Appropriate for age  Orientation Level: Oriented X4;Appropriate for age  Cognition: Appropriate decision making;Appropriate for age  attention/concentration;Appropriate safety awareness  Safety/Judgement: Awareness of environment;Good awareness of safety precautions;Fall prevention  Functional Mobility and Transfers for ADLs:              Bed Mobility:  Rolling: Verbal cues  Supine to Sit: Verbal cues  Sit to Supine: Verbal cues  Scooting: Supervision              Transfers:  Sit to Stand: Supervision              Toilet Transfer : Supervision/set up  Balance:  Sitting: Intact  Standing: Intact;With support  ADL Intervention:  Feeding  Food to Mouth: Independent  Drink to Mouth: Independent    Grooming  Washing Face: Supervision/set-up  Washing Hands: Supervision/set-up    Lower Body Bathing  Bathing Assistance: Supervision/set-up  Position Performed: Seated edge of bed  Adaptive Equipment: Long handled sponge;Reacher    Lower Body Dressing Assistance  Underpants: Supervision/set-up  Socks: Supervision/set-up  Adaptive Equipment Used: Sock aid;Walker;Reacher    Cognitive Retraining  Safety/Judgement: Awareness of environment;Good awareness of safety precautions;Fall prevention  Pain:  Pain Scale 1: Numeric (0 - 10)  Pain Intensity 1: 5  Pain Location 1: Back  Pain Orientation 1: Lower  Pain Description 1: Aching;Sore  Pain Intervention(s) 1: Rest    Activity Tolerance:    Good  Please refer to the flowsheet for vital signs taken during this treatment.  After treatment:   [ ]   Patient left in no apparent distress sitting up in chair  [X]   Patient left in no apparent distress in bed  [X]   Call bell left within reach  [X]   Nursing  notified  [ ]   Caregiver present  [ ]   Bed alarm activated    Maryfrances Bunnell, OTR/L  Time Calculation: 15 mins

## 2012-11-07 NOTE — Progress Notes (Signed)
Progress Note POD #3      Patient: Marissa Harrell               Sex: female          DOA: 11/04/2012         Date of Birth:  [birth year]      Surgery: Procedure(s):  L3-5 DECOMPRESSION & FUSION W/C-ARM           LOS: 3 days               Subjective:     C/O weakness and loss of appetite.    Objective:      Visit Vitals   Item Reading   ??? BP 107/57   ??? Pulse 74   ??? Temp 98.8 ??F (37.1 ??C)   ??? Resp 16   ??? Ht 5\' 1"  (1.549 m)   ??? Wt 39.345 kg (86 lb 11.8 oz)   ??? BMI 16.4 kg/m2   ??? SpO2 97%       Physical Exam:  Neurological: motor strength: 5/5 in lower extremities bilaterally                          sensation: intact to light touch  Patient mobility  Bed Mobility Training  Rolling: CGA  Supine to Sit: CGA  Sit to Supine: Minimum assistance  Scooting: CGA  Transfer Training  Sit to Stand: Supervision  Stand to Sit: Supervision      Gait Training  Assistive Device: Brace/Splint;Gait belt;Walker, rolling  Ambulation - Level of Assistance: SBA;CGA  Distance (ft): 150 Feet (ft)  Interventions: Verbal cues       Patient Response  Patient Response: Fair    Intake and Output:  Current Shift:     Last three shifts:  11/20 1900 - 11/22 0659  In: 1540 [P.O.:1540]  Out: 10 [Drains:10]    Lab/Data Reviewed:    Lab/Data Reviewed:  Lab Results   Component Value Date/Time    WBC 8.7 11/07/2012  4:05 AM    HGB 7.6 11/07/2012  4:05 AM    HCT 22.1 11/07/2012  4:05 AM    PLATELET 156 11/07/2012  4:05 AM    MCV 93.6 11/07/2012  4:05 AM     Lab Results   Component Value Date/Time    aPTT 27.9 10/29/2012 11:15 AM     Lab Results   Component Value Date/Time    INR 1.1 10/29/2012 11:15 AM    INR 1.0 05/02/2010  4:30 PM    Prothrombin time 14.0 10/29/2012 11:15 AM    Prothrombin time 13.3 05/02/2010  4:30 PM            Assessment/Plan     Active Problems:    DDD (degenerative disc disease), lumbar (10/30/2012)      HTN (hypertension) (11/05/2012)      Anemia, unspecified (11/05/2012)      Esophageal reflux (11/05/2012)        1. Stable  2. OOB  with PT  3. D/C Planning  4. D/Cdrain  5. Change dressing  6. Acute blood loss anemia- Transfuse 2U PRBC.  7. D/C home today after cleared by P.T & completed blood transfusion.      Dr Lisette Grinder has seen the patient today as well and agrees with above.

## 2012-11-07 NOTE — Progress Notes (Signed)
Received message that patient had changed her mind about placement.  Met with patient and her dtr Marissa Harrell.  Pt stated that:  she now planned to go home with home health and her dtrs helping; she had a back brace, a rolling walker, and a 3-in-1 commode.    Marissa Harrell (cell: [phone]) stated that she and her sister Marissa Harrell (cell: [phone])  would take turns caring for pt.  List of home health agencies provided; [facility] completed for [facility] (MD office had already arranged) [phone]; referral placed to [facility] who will also notify [facility] that pt had decided to go home instead.    PLAN: home with [facility] [phone]) and assistance from dtrs Marissa Harrell and Marissa Harrell.

## 2012-11-07 NOTE — Progress Notes (Signed)
2nd unit PRBC began in progress see blood admin via doc flow sheet for v/s monitoring.

## 2012-11-07 NOTE — Progress Notes (Signed)
2nd unit of prbc blood transfusion completed without incidence, no noted reaction, see doc flow sheet blood admin for v/s monitoring, instructed of discharge.

## 2012-11-07 NOTE — Progress Notes (Signed)
1st unit of PRBC completed without incidence. No noted reaction.

## 2012-11-07 NOTE — Progress Notes (Signed)
Bedside shift change report given to Maurine Minister RN (oncoming nurse) by Gerald Stabs RN (offgoing nurse).  Report given with SBAR, Kardex and Recent Results.

## 2012-11-07 NOTE — Progress Notes (Signed)
Problem: Mobility Impaired (Adult and Pediatric)  Goal: *Acute Goals and Plan of Care (Insert Text)  Physical Therapy Goals  Initiated 11/05/2012 and to be accomplished within 1-4 day(s)  Bed mobility: Demonstrate proper log-roll technique for safe initiation of rolling for OOB activities.  Transfers:   Supine to sit to supine S with HR for meals.  Sit to stand to sit S with RW/LSO in prep for ambulation.  Gait: Ambulate 175ft S with RW/LSO, for home/community mobility.  Activity tolerance: Tolerate up in chair 30 minutes-1 hour for ADL???s.  Patient/Family Education: Patient/family to be independent with HEP for follow-up care and safe discharge.     Addendum: 11/07/12 Stair Goal:    Patient to ascend/descend > 3 steps CGA with HR for home entry.   Outcome: Resolved/Met Date Met:  11/07/12  PHYSICAL THERAPY TREATMENT/DISCHARGE    Patient: Marissa Harrell (78 y.o. female)  Date: 11/07/2012  Diagnosis: LUMBAR SPONDYLOSIS, LUMBAGO,SCIATICA, SPINAL STENOSIS, HISTORY CARDIOVASCULAR DISEASE (CARDIAC STENTS)  Spinal stenosis, lumbar Spinal stenosis, lumbar  Procedure(s) (LRB):  L3-5 DECOMPRESSION & FUSION W/C-ARM (N/A) 3 Days Post-Op  Precautions: Fall;Spinal;Back  Chart, physical therapy assessment, plan of care and goals were reviewed.      ASSESSMENT:  Pt ambulating with a reciprocal gt pattern, no LOB or path deviations.  Progression toward goals:  [X]       Goals met  [ ]       Improving appropriately and progressing toward goals  [ ]       Improving slowly and progressing toward goals  [ ]       Not making progress toward goals and plan of care will be adjusted       PLAN:  Patient will be discharged from physical therapy at this time.  Rationale for discharge:  [X]  Goals Achieved  [ ]  Plateau Reached  [ ]  Patient not participating in therapy  [ ]  Other:  Discharge Recommendations:  Home Health  Further Equipment Recommendations for Discharge:  brace/splint and rolling walker       SUBJECTIVE:   Patient stated ???I am  better just my legs are weak.???      OBJECTIVE DATA SUMMARY:   Critical Behavior:  Neurologic State: Alert  Orientation Level: Oriented X4  Cognition: Appropriate for age attention/concentration;Appropriate decision making;Appropriate safety awareness  Safety/Judgement: Awareness of environment;Fall prevention;Good awareness of safety precautions;Insight into deficits  Functional Mobility Training:  Bed Mobility:  Rolling: Verbal cues  Supine to Sit: Verbal cues  Sit to Supine: Verbal cues  Scooting: Supervision  Transfers:  Sit to Stand: Supervision  Stand to Sit: Modified independence, requires equipment  Balance:  Sitting: Intact  Standing: Intact;With support  Ambulation/Gait Training:  Distance (ft): 525 Feet (ft)  Assistive Device: Brace/Splint;Gait belt;Walker, rolling  Ambulation - Level of Assistance: SBA;Supervision/Set-up  Gait Abnormalities: Antalgic  Base of Support: Narrowed  Speed/Cadence: Slow  Stairs:  Number of Stairs Trained: 3  Stairs - Level of Assistance: Stand-by assistance;Verbal cues              Rail Use: Right     Pain:  Pain Scale 1: Numeric (0 - 10)  Pain Intensity 1: 5  Pain Location 1: Back  Pain Orientation 1: Lower  Pain Description 1: Aching;Sore  Pain Intervention(s) 1: Rest  Activity Tolerance:   Good.  Pt cleared from inpatient PT services.  Please refer to the flowsheet for vital signs taken during this treatment.  After treatment:   [ ]  Patient left in no apparent distress  sitting up in chair  [X]  Patient left in no apparent distress in bed  [X]  Call bell left within reach  [X]  Nursing notified  [ ]  Caregiver present  [ ]  Bed alarm activated  Doreen Beam, PTA   Time Calculation: 35 mins

## 2012-11-07 NOTE — Discharge Summary (Addendum)
Discharge Summary    Patient: Marissa Harrell               Sex: female          DOA: 11/04/2012         Date of Birth:  05/30/1928      Age:  78 y.o.        LOS:  LOS: 3 days                Admit Date: 11/04/2012    Discharge Date: 11/07/2012    Admission Diagnoses: LUMBAR SPONDYLOSIS, LUMBAGO,SCIATICA, SPINAL STENOSIS, HISTORY CARDIOVASCULAR DISEASE (CARDIAC STENTS)  Spinal stenosis, lumbar    Discharge Diagnoses:    Problem List as of 11/07/2012 Date Reviewed: 11/13/2012        Codes Class Noted - Resolved    HTN (hypertension) 401.9  11/13/12 - Present        Anemia, unspecified 285.9  Nov 13, 2012 - Present        Esophageal reflux 530.81  11-13-12 - Present        DDD (degenerative disc disease), lumbar 722.52  10/30/2012 - Present        *RESOLVED: Spinal stenosis, lumbar 724.02  10/30/2012 - 13-Nov-2012    Acute blood loss anemai          Discharge Condition: Good    Hospital Course: The patient underwent L3-5 decompression & fusion on 11/04/2012. The patient tolerated the procedure well. Vital signs remained stable and the patient was transferred to  2nd floor surgical unit without complications. The patient remained neurovascularly intact and began getting out of bed with physical therapy on  POD #1. Pain was controlled with  No basal rate Dilaudid PCA and Dilaudid pills for break through pain. Dilaudid caused nausea. She preferred to continue with Tylenol & Ultram prn. The PCA pump and foley catheter were discontinued on POD#1. The drain was discontinued on POD#2.The patient continued to  progress with physical therapy and was ambulating 150 feet on POD #3 and was therefore discharged home the evening of POD#3. The patient received 2 u PRBC's on POD #3 for acute blood loss anemia.    Consults: Hospitalist    Significant Diagnostic Studies:   Recent Labs      11/07/12   0405  11/06/12   0418  Nov 13, 2012   0205   HGB  7.6*  8.2*  8.4*     Recent Labs      11/07/12   0405  11/06/12   0418  11-13-2012   0205    HCT  22.1*  23.4*  24.6*       Current Discharge Medication List      START taking these medications    Details   traMADol (ULTRAM) 50 mg tablet Take 1-2 Tabs by mouth every six (6) hours as needed.  Qty: 90 Tab, Refills: 0         CONTINUE these medications which have NOT CHANGED    Details   acetaminophen (TYLENOL EXTRA STRENGTH) 500 mg tablet Take 500 mg by mouth every six (6) hours as needed for Pain.      CALCIUM CARBONATE/VITAMIN D3 (CALCIUM 600 + D,3, PO) Take  by mouth daily.      hydroxychloroquine (PLAQUENIL) 200 mg tablet Take 200 mg by mouth daily.      magnesium 200 mg Tab Take  by mouth daily.      carvedilol (COREG CR) 20 mg CR capsule Take 20 mg by mouth  daily (with breakfast).      cevimeline (EVOXAC) 30 mg capsule Take 30 mg by mouth three (3) times daily. 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      telmisartan (MICARDIS) 20 mg tablet Take 20 mg by mouth daily. Indications: HYPERTENSION      cycloSPORINE (RESTASIS) 0.05 % ophthalmic emulsion Administer 1 Drop to both eyes two (2) times a day.      peg 400-propylene glycol (SYSTANE) 0.4-0.3 % Drop Administer 1 Drop to both eyes as needed.             Activity: activity as tolerated, weight bearing as tolerated. Wear lumbar brace when out of bed. No lifting over 20 pounds.     No anti- inflammatory medications for 3 months. Use stool softeners at home while taking pain medications since  they are constipating.    Diet: Regular Diet    Wound Care: Keep wound clean and dry, Reinforce dressing PRN and ice to area for comfort. Do not get wound wet for 5 days.    Follow-up: 10 days with Dr Lisette Grinder

## 2012-11-07 NOTE — Progress Notes (Signed)
1st unit PRBC began in progress, see blood transfusion via doc flow sheet, remain with patient the 1st 15 minutes, pt denies any reaction:chill, back ache, nausea, no noted rash. Will continue to monitor.

## 2012-11-07 NOTE — Progress Notes (Signed)
Ambulatory in hallway with physical therapy. Back brace on . Tolerated well.

## 2012-11-07 NOTE — Progress Notes (Signed)
Discharged to home accompanied by daughter, transport here, taken via wheelchair to pvt car. See discharge summary note.

## 2012-11-07 NOTE — Progress Notes (Signed)
Shift assessment completed,see doc flow sheet in computer. Neurovascular status intact, denies any complaints of numbness and or tingling sensation, positive pulses bilateral, dressing mepilex mid low back incision clean dry intact, back brace applied, ambulatory with walker and nurse supervision to bathroom to void. Tolerated well. States pain@ 4/10 on pain scale.

## 2012-11-07 NOTE — Progress Notes (Signed)
Problem: Mobility Impaired (Adult and Pediatric)  Goal: *Acute Goals and Plan of Care (Insert Text)  Physical Therapy Goals  Initiated 11/05/2012 and to be accomplished within 1-4 day(s)  Bed mobility: Demonstrate proper log-roll technique for safe initiation of rolling for OOB activities.  Transfers:   Supine to sit to supine S with HR for meals.  Sit to stand to sit S with RW/LSO in prep for ambulation.  Gait: Ambulate 130ft S with RW/LSO, for home/community mobility.  Activity tolerance: Tolerate up in chair 30 minutes-1 hour for ADL???s.  Patient/Family Education: Patient/family to be independent with HEP for follow-up care and safe discharge.     Addendum: 11/07/12 Stair Goal:    Patient to ascend/descend > 3 steps CGA with HR for home entry.    Added to stair goal per discussion with PTA.

## 2012-11-07 NOTE — Progress Notes (Signed)
Pt received spa care in bed, gave a complete bath,change linen,lotion, back rub,music and body massage. Left pt relaxing in chair with daughter in the room.

## 2012-11-07 NOTE — Progress Notes (Signed)
Problem: Mobility Impaired (Adult and Pediatric)  Goal: *Acute Goals and Plan of Care (Insert Text)  Physical Therapy Goals  Initiated 11/05/2012 and to be accomplished within 1-4 day(s)  Bed mobility: Demonstrate proper log-roll technique for safe initiation of rolling for OOB activities.  Transfers:   Supine to sit to supine S with HR for meals.  Sit to stand to sit S with RW/LSO in prep for ambulation.  Gait: Ambulate 172ft S with RW/LSO, for home/community mobility.  Activity tolerance: Tolerate up in chair 30 minutes-1 hour for ADL???s.  Patient/Family Education: Patient/family to be independent with HEP for follow-up care and safe discharge.   Outcome: Not Progressing Towards Goal  Pt reports just returning to supine because she was feeling dizzy sitting at the EOB.  Pt to receive 2 units of PRBCs.  Will attempt to treat again in between units.

## 2012-11-08 LAB — TYPE AND SCREEN
ABO/Rh: A POS
Antibody Screen: NEGATIVE
Status: TRANSFUSED
Status: TRANSFUSED
Unit Divison: 0
Unit Divison: 0

## 2012-11-08 LAB — TYPE & SCREEN
ABO/Rh(D): A POS
Antibody screen: NEGATIVE
Status of unit: TRANSFUSED
Status of unit: TRANSFUSED
Unit division: 0
Unit division: 0

## 2013-07-28 NOTE — Progress Notes (Signed)
H&P signed by Alesia Banda, MD at 03/23/10 1101      Author: Alesia Banda, MD Service: (none) Author Type: Physician    Filed: 03/23/10 1101 Note Time: 03/23/10 1056             LOCATION OF PATIENT:   [facility]   DATE OF PROCEDURE:   03/23/2010   PRE-PROCEDURE DIAGNOSIS:   Mixed urinary incontinence s/p urethral sling / bladder suspension at [facility]   POST-PROCEDURE DIAGNOSIS:   Same   PROCEDURE PERFORMED:   Flexible cystourethroscopy   SURGEON:   Alesia Banda, MD   ANESTHESIA:   Local lidocaine, intraurethral jelly.   FINDINGS: Well supported bladder / No SUI in supine position   SPECIMEN: None   INDICATION FOR PROCEDURE:   The patient is a 79 y.o. female with mixed urinary incontinence Improved on gelnique   DESCRIPTION OF PROCEDURE:   she was taken to the cysto suite and placed in the dorsal lithotomy position, prepped and draped. Cystourethroscopy was performed with the flexible cystourethroscope. The urethra was unremarkable. The trigone was normal. The remainder of the bladder was unremarkable and a retroflexed view of the bladder neck was normal. There were no erythematous areas, stones, or papillary tumors seen. Urine cytology was not obtained at this time. No SUI seen in supine postion. The patient was then discharged with routine instructions. Follow up in the office in 6 months.   Alesia Banda, MD   10:56 AM, 03/23/2010   Cc: Non-Staff Physician

## 2013-07-28 NOTE — Progress Notes (Signed)
Procedures signed by Alesia Banda, MD at 03/23/10 1101      Author: Alesia Banda, MD Service: (none) Author Type: Physician    Filed: 03/23/10 1101 Note Time: 03/23/10 1101             Procedures    1. CYSTOURETHROSCOPY [GU3]           LOCATION OF PATIENT:   [facility]   DATE OF PROCEDURE:   03/23/2010   PRE-PROCEDURE DIAGNOSIS:   Mixed urinary incontinence s/p urethral sling / bladder suspension at [facility]   POST-PROCEDURE DIAGNOSIS:   Same   PROCEDURE PERFORMED:   Flexible cystourethroscopy   SURGEON:   Alesia Banda, MD   ANESTHESIA:   Local lidocaine, intraurethral jelly.   FINDINGS: Well supported bladder / No SUI in supine position   SPECIMEN: None   INDICATION FOR PROCEDURE:   The patient is a 79 y.o. female with mixed urinary incontinence Improved on gelnique   DESCRIPTION OF PROCEDURE:   she was taken to the cysto suite and placed in the dorsal lithotomy position, prepped and draped. Cystourethroscopy was performed with the flexible cystourethroscope. The urethra was unremarkable. The trigone was normal. The remainder of the bladder was unremarkable and a retroflexed view of the bladder neck was normal. There were no erythematous areas, stones, or papillary tumors seen. Urine cytology was not obtained at this time. No SUI seen in supine postion. The patient was then discharged with routine instructions. Follow up in the office in 6 months.   Alesia Banda, MD   10:56 AM, 03/23/2010   Cc: Non-Staff Physician

## 2013-07-28 NOTE — Progress Notes (Signed)
H&P signed by Alesia Banda, MD at 03/23/10 1056      Author: Alesia Banda, MD Service: (none) Author Type: Physician    Filed: 03/23/10 1056 Note Time: 03/23/10 1037             [facility]   Dr. Alesia Banda   [address]   [place], [place] [address]   Chief Complaint   Patient presents with   ??? INCONTINENCE   History of Present Illness:   Marissa Harrell is a 79 y.o. female referred by Non-Staff Physician   03/15/2010 Patient h/o urethral dilatation . c/o constant urge to void , nocturia 3-4X , SUI with cough sneeze , Doesn't wear pads . Tried detrol but dried her mouth out With her srogens syndrome , Patient denies:fever and hematuria, s/p bladder suspension / sling about 2 years ago at [facility]   ASSESSMENT/ PLAN:   ->mixed urinary incontinence s/p urethral sling   Sig dry mouth / eyes fram other anticholinergics   Asked about Gelnique , will try 1 month samples   Urgency improved on Gelnique so far Has occ SUI   PHYSICAL EXAMINATION:   Temp(Src) 97.8 ??F (36.6 ??C) (Oral)   Ht 5' (1.524 m)   Wt 97 lb (43.999 kg)   BMI 18.94 kg/m2, Body mass index is 18.94 kg/(m^2).   Head & Neck:> NC/AT, EOMI   Constitutional:> in no apparent distress and well developed and well nourished   Neurological:> alert and oriented, normal without focal findings, nl gait   Cardivascular:> RRR,   Skin:> no rashes, no nodules, no jaundice   Abdomen:> soft, non-tender. No masses, no organomegaly   GU:> Deferred to cysto   Flank:> No Flank Pain   Extremeties:> No edema   1. Incontinence of urine (788.30N) UVA SYMPTOMS SCORE, Machine Read Dip W/Micro, PRESENCE OR ABSENCE OF URINARY INCONTINENCE ASSESSED, URINARY INCONTINENCE CHARACTERIZED (FREQ, VOL, TIMING,HOW BOTHERSOME)   Orders Placed This Encounter   Procedure   ??? Presence or absence of urinary incontinence assessed   ??? Urinary incontinence characterized (freq, vol, timing,how bothersome)   Review of Systems:   Pertinent items are noted in HPI.   Past  Surgical History   Procedure Date   ??? Hx bladder suspension   Past Medical History   Diagnosis Date   ??? Urinary incontinence   ??? Hypertension   ??? Mixed stress and urge urinary incontinence 03/15/2010   Patient Active Problem List   Diagnoses Date Noted   ??? Mixed stress and urge urinary incontinence [788.33K] 03/15/2010   S/p sling   Social History:   History   Smoking status   ??? Never Smoker   Smokeless tobacco   ??? Never Used   CURRENT MEDS & ALLERGIES:   Current outpatient prescriptions   Medication Sig Dispense Refill   ??? esomeprazole (NEXIUM) 40 mg PO CPDR Take 40 mg by Mouth Once a Day.   ??? telmisartan (MICARDIS) 20 mg PO TABS Take 20 mg by Mouth Once a Day.   ??? carvedilol (COREG) 6.25 mg PO TABS Take 6.25 mg by Mouth Twice Daily.   ??? isosorbide mononitrate CR (IMDUR) 30 mg PO TB24 Take 30 mg by Mouth Once a Day.   ??? clopidogrel (PLAVIX) 75 mg PO TABS Take 75 mg by Mouth Once a Day.   ??? telmisartan (MICARDIS) 40 mg PO TABS Take 40 mg by Mouth Once a Day.   ??? nitroglycerin (NITROLINGUAL) 0.4 mg/dose TL Spry Dissolve 1 Spray  under tongue Every 5 Minutes as Needed.   ??? nitroglycerin (NITROSTAT) 0.4 mg SL SUBL Dissolve 0.4 mg under tongue Every 5 Minutes as Needed.   ??? aspirin 81 mg PO CHEW Take 81 mg by Mouth Once a Day.   ??? vitamin E (AQUASOL E) 400 unit PO CAPS Take 400 Units by Mouth Once a Day.   ??? cevimeline (EVOXAC) 30 mg PO CAPS Take 30 mg by Mouth 3 Times Daily.   ??? cycloSPORINE (RESTASIS) 0.05 % OP Dpet Instill 1 Drop in affected eye(s) Every 12 Hours.   Allergies not on file   LABS & RESULTS:   Results for orders placed in visit on 03/15/10   UVA SYMPTOMS SCORE   Component Value Range   ??? IBE UVA 3 0 - 5   ??? FREQUENCY UVA 4 0 - 5   ??? INTERMITTENCY UVA 0 0 - 5   ??? URGENCY UVA 1 0 - 5   ??? WEAK STREAM UVA 2 0 - 5   ??? STRANGURIA UVA 0 0 - 5   ??? NOCTURIA UVA 4 0 - >=5 (times)   ??? TOTAL UVA SYMPTOMS SCORE 14 0 - 35   ??? BOTHER UVA 4 0 - 6   URINALYSIS WITH MICROSCOPIC   Component Value Range   ??? SPECIMEN TYPE  void   ??? URINE COLOR yellow   ??? URINE TURBIDITY clear   ??? URINE GLUCOSE n Negative - Negative (mg/dL)   ??? URINE BILIRUBIN n Negative - Negative (Positive/Negative)   ??? URINE KETONES n Negative - Negative (mg/dL)   ??? URINE SPECIFIC GRAVITY 1.015 1.003 - 1.030   ??? URINE OCCULT BLOOD trace-intact (*) Negative - Negative (Positive/Negative)   ??? URINE pH 7.0 5.0 - 7.0   ??? URINE PROTEIN SCREEN n Negative - Negative (mg/dL)   ??? URINE UROBILINOGEN 0.2 0.1 - 1.0 (mg/dL)   ??? URINE NITRITE n Negative - Negative (Positive/Negative)   ??? URINE LEUKOCYTE ESTERASE small (*) Negative - Negative (Positive/Negative)   ??? WBC URINE few (/hpf)   ??? RBC URINE n (/hpf)   ??? URINE EPITHELIAL CELLS few (/hpf)   ??? URINE BACTERIA n (Positive/Negative)   ??? CAST   ??? URINE CRYSTALS   ??? OTHER OBSERVATIONS URINE   This note has been sent to Non-Staff Physician, the referring physican, with findings and recommendations

## 2013-07-29 LAB — AMB POC URINALYSIS DIP STICK AUTO W/ MICRO (MICRO RESULTS)
Bacteria (UA POC): NEGATIVE
Bilirubin (UA POC): NEGATIVE
Glucose (UA POC): NEGATIVE
Ketones (UA POC): NEGATIVE
Leukocyte esterase (UA POC): NEGATIVE
Nitrites (UA POC): NEGATIVE
Protein (UA POC): NEGATIVE mg/dL
RBCs (UA POC): NEGATIVE
Specific gravity (UA POC): 1.01 (ref 1.001–1.035)
Urobilinogen (UA POC): 0.2 (ref 0.2–1)
WBCs (UA POC): NEGATIVE
pH (UA POC): 5.5 (ref 4.6–8.0)

## 2013-07-29 NOTE — Progress Notes (Signed)
Marissa Harrell  08/18/1928    Assessment:    ICD-9-CM    1. Incontinence of urine 788.30 AMB POC URINALYSIS DIP STICK AUTO W/ MICRO (MICRO RESULTS)   2. OAB (overactive bladder) 596.51        Plan:  1. Trial myrbetriq   Cysto     Chief Complaint   Patient presents with   ??? Urinary Incontinence     History of Present Illness: Marissa Harrell is a 80 y.o. female who presents today for s/p bladder in [facility] [employer] > 10 years ago  Had problems with catheter .  Now if under stress c/o OAB  Tried Toviaz without any help     Now on detrol LA   Has some mild SUI , mostly OAB  Had urge incontinence once .     Marland Kitchen Has Sjogren's syndrome [710.2] and c/o dry mouth.   Will try myrbetriq  25/50 mg  Nocturia 3-4    , dysuria n , frequency y , urgency y, hematuria n     Problem List Date Reviewed: 07/29/2013        ICD-9-CM Class Noted    HTN (hypertension) 401.9  11/05/2012        Anemia, unspecified 285.9  11/05/2012        Esophageal reflux 530.81  11/05/2012        DDD (degenerative disc disease), lumbar 722.52  10/30/2012              We are asked to see for evaluation and management by Dr. Diona Fanti .       Past Medical History   Diagnosis Date   ??? Sjogren's syndrome    ??? Hypertension    ??? Heart attack    ??? Arthritis    ??? CAD (coronary artery disease)      s/p stents, EF 33%   ??? GERD (gastroesophageal reflux disease)      well controlled   ??? Psychiatric disorder      anxiety   ??? Nausea & vomiting    ??? Chronic kidney disease      renal artery stenosis       Past Surgical History   Procedure Laterality Date   ??? Pr cardiac surg procedure unlist       stent x 3   ??? Hx appendectomy     ??? Hx urological       bladder suspension   ??? Hx cataract removal       bilateral       History     Social History   ??? Marital Status: MARRIED     Spouse Name: N/A     Number of Children: N/A   ??? Years of Education: N/A     Social History Main Topics   ??? Smoking status: Former Smoker   ??? Smokeless tobacco: Never Used   ??? Alcohol Use: Yes      Comment:  1 per week   ??? Drug Use: No   ??? Sexually Active: Not Currently -- Female partner(s)     Other Topics Concern   ??? Not on file     Social History Narrative   ??? No narrative on file       History reviewed. No pertinent family history.    Outpatient Encounter Prescriptions as of 07/29/2013   Medication Sig Dispense Refill   ??? nitroglycerin (NITROSTAT) 0.4 mg SL tablet by SubLINGual route every five (5) minutes as needed for  Chest Pain.       ??? atorvastatin (LIPITOR) 40 mg tablet Take  by mouth daily.       ??? oxyCODONE IR (ROXICODONE) 5 mg immediate release tablet Take 5 mg by mouth every four (4) hours as needed for Pain.       ??? esomeprazole (NEXIUM) 40 mg capsule Take  by mouth daily.       ??? traZODone (DESYREL) 50 mg tablet Take  by mouth nightly.       ??? zolpidem (AMBIEN) 5 mg tablet Take  by mouth nightly as needed for Sleep.       ??? ferrous sulfate (IRON) 325 mg (65 mg iron) tablet Take  by mouth Daily (before breakfast).       ??? tolterodine (DETROL) 2 mg tablet Take 2 mg by mouth two (2) times a day.       ??? acetaminophen (TYLENOL EXTRA STRENGTH) 500 mg tablet Take 500 mg by mouth every six (6) hours as needed for Pain.       ??? CALCIUM CARBONATE/VITAMIN D3 (CALCIUM 600 + D,3, PO) Take  by mouth daily.       ??? hydroxychloroquine (PLAQUENIL) 200 mg tablet Take 200 mg by mouth daily.       ??? magnesium 200 mg Tab Take  by mouth daily.       ??? carvedilol (COREG CR) 20 mg CR capsule Take 20 mg by mouth daily (with breakfast).       ??? cevimeline (EVOXAC) 30 mg capsule Take 30 mg by mouth three (3) times daily. Indications: XEROSTOMIA SECONDARY TO SJOGREN'S SYNDROME       ??? telmisartan (MICARDIS) 20 mg tablet Take 20 mg by mouth daily. Indications: HYPERTENSION       ??? cycloSPORINE (RESTASIS) 0.05 % ophthalmic emulsion Administer 1 Drop to both eyes two (2) times a day.       ??? [DISCONTINUED] traMADol (ULTRAM) 50 mg tablet Take 1-2 Tabs by mouth every six (6) hours as needed.  90 Tab  0   ??? [DISCONTINUED]  ezetimibe-simvastatin (VYTORIN 10/40) 10-40 mg per tablet Take 1 Tab by mouth nightly.       ??? [DISCONTINUED] peg 400-propylene glycol (SYSTANE) 0.4-0.3 % Drop Administer 1 Drop to both eyes as needed.         No facility-administered encounter medications on file as of 07/29/2013.       Allergies   Allergen Reactions   ??? Codeine Other (comments)     Gets very hyper       Review of Systems  Constitutional: Fever: No  Skin: Rash: No  HEENT: Hearing difficulty: No  Eyes: Blurred vision: No  Cardiovascular: Chest pain: No  Respiratory: Shortness of breath: No  Gastrointestinal: Nausea/vomiting: No  Musculoskeletal: Back pain: No  Neurological: Weakness: No  Psychological: Memory loss: No  Comments/additional findings:   All other systems reviewed and are negative         Physical Exam:  BP 120/70   Ht 5' (1.524 m)   Wt 90 lb (40.824 kg)   BMI 17.58 kg/m2  Constitutional: Well developed, well-nourished female in no acute distress.   CV:  No peripheral swelling noted  Respiratory: No respiratory distress or difficulties  Abdomen:  Soft and nontender. No masses. No hepatosplenomegaly.   GU Female:  No CVA tenderness. No hysterectomy   Skin:  Normal color. No evidence of jaundice.     Neuro/Psych:  Patient with appropriate affect.  Alert and oriented.  Lymphatic:   No enlargement of supraclavicular lymph nodes.      UA:   Results for orders placed in visit on 07/29/13   AMB POC URINALYSIS DIP STICK AUTO W/ MICRO (MICRO RESULTS)       Result Value Range    Color (UA POC) Yellow      Clarity (UA POC) Clear      Glucose (UA POC) Negative  Negative    Bilirubin (UA POC) Negative  Negative    Ketones (UA POC) Negative  Negative    Specific gravity (UA POC) 1.010  1.001 - 1.035    Blood (UA POC) 1+  Negative    pH (UA POC) 5.5  4.6 - 8.0    Protein (UA POC) Negative  Negative mg/dL    Urobilinogen (UA POC) 0.2 mg/dL  0.2 - 1    Nitrites (UA POC) Negative  Negative    Leukocyte esterase (UA POC) Negative  Negative    Epithelial  cells (UA POC)        WBCs (UA POC) n      RBCs (UA POC) n      Bacteria (UA POC) n      Crystals (UA POC)    Negative    Other (UA POC)           Alesia Banda, MD on 07/29/2013          Quality Measure--Urinary Incontinence Characterization Women Aged 45 Years and Older.  Urinary incontinence needs to be characterized at least once within 12 months for patients with a diagnosis of urinary incontinence.    Urinary Incontinence Characteristics:  leaks with urgency

## 2013-08-10 LAB — AMB POC URINALYSIS DIP STICK AUTO W/ MICRO (MICRO RESULTS)
Bacteria (UA POC): NEGATIVE
Bilirubin (UA POC): NEGATIVE
Glucose (UA POC): NEGATIVE
Ketones (UA POC): NEGATIVE
Leukocyte esterase (UA POC): NEGATIVE
Nitrites (UA POC): NEGATIVE
Protein (UA POC): NEGATIVE mg/dL
RBCs (UA POC): NEGATIVE
Specific gravity (UA POC): 1.035 (ref 1.001–1.035)
Urobilinogen (UA POC): 0.2 (ref 0.2–1)
WBCs (UA POC): NEGATIVE
pH (UA POC): 5.5 (ref 4.6–8.0)

## 2013-08-10 NOTE — Progress Notes (Signed)
Patient given 1 Cipro tablet by mouth prior to procedure.

## 2013-08-10 NOTE — Progress Notes (Signed)
Problem List Date Reviewed: 08/10/2013        ICD-9-CM Class Noted    OAB (overactive bladder) 596.51  08/10/2013    Overview    Signed 08/10/2013  1:54 PM by Alesia Banda, MD      79 y.o. female who presents today for s/p bladder in the [employer] > 10 years ago Had problems with catheter . Now if under stress c/o OAB Tried Toviaz without any help Now on detrol LA Has some mild SUI , mostly OAB Had urge incontinence once . Marland Kitchen Has Sjogren's syndrome [710.2] and c/o dry mouth. Will try myrbetriq 25/50 mg Nocturia 3-4             HTN (hypertension) 401.9  11/05/2012        Anemia, unspecified 285.9  11/05/2012        Esophageal reflux 530.81  11/05/2012        DDD (degenerative disc disease), lumbar 722.52  10/30/2012                CYSTOSCOPY PROCEDURE    Patient Name: Marissa Harrell            Date of Procedure: 08/10/2013     Procedure: Cystoscopy    Indications: This is a 79 y.o. female who presents with OAB   Trial of myrbetriq   Has Sjogren's syndrome [710.2] and c/o dry mouth.  Myrbetriq did not really help , will try PTNS , increase myrbetriq to 50 mg     Preoperative Diagnosis:     ICD-9-CM    1. OAB (overactive bladder) 596.51 AMB POC URINALYSIS DIP STICK AUTO W/ MICRO (MICRO RESULTS)     CYSTOURETHROSCOPY     REFERRAL TO PHYSICAL THERAPY ([facility])       Lidocaine Jelly:  yes    Consent:  All risks, benefits and options were reviewed in detail and the patient agrees to procedure. Risks include but are not limited to bleeding, infection, sepsis, death, dysuria and others.     Procedure:  The patient was placed in the lithotomy position, and prepped and draped in the normal fashion. 5 ml of 4% Lidocaine gel was placed in the urethra. Once adequate anesthesia was achieved; the flexible cystoscope was placed into the bladder.                      Findings:  PE: Alert and oriented  Normal external genitalia   Anterior urethra: Normal without strictures  Bladder neck: open  Bladder mucosa: Intact  Trabeculation:  none,   Diverticula: none  Ureteral orifices: normal    Postoperative Diagnosis: OAB     Plan:  1. PTNS     Vital signs were stable and the patient tolerated the procedure well.      Signed By: Alesia Banda, MD

## 2013-08-26 NOTE — Progress Notes (Signed)
Percutaneous Tibial Nerve Stimulation (PTNS)    Ordering Physician: [name]    Initial Visit    Marissa Harrell, 79 y.o., female presents with complaints of:    Daytime urinary frequency: 4- 5x /day  Urinary urgency: mild - she can wait a few minutes but must start looking for a bathroom  Nocturia: 2-3x/night    Treatment previously tried:  fluid restriction/ behavior modification  kegels independently or guided by [employer] professional  medications; multiple-did not help or only partially    Initial PTNS treatment:    Pt educated regarding treatment using PTNS and understands that 12 visits are initially required with subsequent visits approximately three weeks apart based on symptom improvement with PTNS.     Goals of treatment:  Pt will report normalizing daytime urinary frequency to 5-8 times per day.  Pt will report increasing ability to hold urine to make it to restroom without uncomfortable urgency sensation with normal bladder fullness.   Pt will report decreased episodes of nocturia to 0-2 x/ night.     Marissa Harrell was placed in a comfortable position.    PTNS performed in right ankle.     Response was up leg.    PTNS level 9.       Plan:  12 weekly PTNS treatments to address the above goals.

## 2013-09-02 NOTE — Progress Notes (Signed)
Ordering Physician: [name]    PTNS Visit  # 2    Pt reports:    Average day time frequency  the same    Average incontinence daytime  the same     Average Nocturia   the same    Average Urge sensation  the same    PTNS treatment:    Layal B Fatzinger was placed in a comfortable position.    PTNS performed in right ankle.    Response was bottom of foot.    PTNS level 11.    Notes:     Patient does not note any changes in her urinary symptoms as of yet.     Plan:  Continue weekly sessions

## 2013-09-09 NOTE — Progress Notes (Signed)
Ordering Physician: [name]    PTNS Visit  # 3    Pt reports:    Average day time frequency  the same    Average incontinence daytime  the same     Average Nocturia   the same    Average Urge sensation  the same    PTNS treatment:    Winnell B Fedak was placed in a comfortable position.    PTNS performed in right ankle.    Response was bottom of foot.    PTNS level 8.    Notes:     Patient does not report any changes in her urinary symptoms at this point.     Plan:  Continue weekly sessions

## 2013-09-23 NOTE — Progress Notes (Addendum)
Ordering Physician: [name]    PTNS Visit  # 4    Pt reports:    Average day time frequency  the same    Average incontinence daytime  the same     Average Nocturia   the same    Average Urge sensation  the same    PTNS treatment:    Marissa Harrell was placed in a comfortable position.    PTNS performed in right ankle.    Response was heel sensation and toe sensation.    PTNS level 13.    Notes:     Patient reports no changes as of yet.     Plan:  Continue weekly sessions  Reviewed by Leone Payor, MD 09/25/2013

## 2013-09-30 NOTE — Progress Notes (Addendum)
Ordering Physician: [name]    PTNS Visit  # 5    Pt reports: no change in her urinary symptoms    Average day time frequency  the same    Average incontinence daytime  the same     Average Nocturia   the same    Average Urge sensation  the same    PTNS treatment:    Marissa Harrell was placed in a comfortable position.    PTNS performed in right ankle.    Response was heel sensation and toe sensation.    PTNS level 13.    Notes:     Patient does not note any change in her urinary symptoms and will continue with PTNS treatments.     Plan:  Continue weekly sessions   Reviewed by Drema Halon M.D.      11/03/2013

## 2013-10-07 NOTE — Progress Notes (Addendum)
Ordering Physician: Eldridge Abrahams    PTNS Visit  # 6    Pt reports:    Average day time frequency  the same    Average incontinence daytime  the same     Average Nocturia   the same    Average Urge sensation  the same    PTNS treatment:    Marissa Harrell was placed in a comfortable position.    PTNS performed in right ankle.    Response was bototm of foot.    PTNS level 15.    Notes:     Patient did report any significant changes this week and will continue with weekly PTNS appointments.     Plan:  Continue weekly sessions  Reviewed by Leone Payor, MD 10/07/2013

## 2013-10-14 NOTE — Progress Notes (Addendum)
Ordering Physician: [name]    PTNS Visit  # 7    Pt reports:    Average day time frequency  the same    Average incontinence daytime  the same     Average Nocturia   the same    Average Urge sensation  the same    PTNS treatment:    Marissa Harrell was placed in a comfortable position.    PTNS performed in right ankle.    Response was heel sensation.    PTNS level 7.    Notes:     Patient states she has not noticed any changes in her urinary symptoms.     Plan:  Continue weekly sessions    Reviewed by Drema Halon

## 2013-12-31 LAB — AMB POC URINALYSIS DIP STICK AUTO W/ MICRO (MICRO RESULTS)
Bacteria (UA POC): NEGATIVE
Bilirubin (UA POC): NEGATIVE
Glucose (UA POC): NEGATIVE
Ketones (UA POC): NEGATIVE
Leukocyte esterase (UA POC): NEGATIVE
Nitrites (UA POC): NEGATIVE
Protein (UA POC): NEGATIVE mg/dL
RBCs (UA POC): NEGATIVE
Specific gravity (UA POC): 1.015 (ref 1.001–1.035)
Urobilinogen (UA POC): 0.2 (ref 0.2–1)
WBCs (UA POC): NEGATIVE
pH (UA POC): 6.5 (ref 4.6–8.0)

## 2013-12-31 NOTE — Progress Notes (Addendum)
Charlsie Merlesngeborg B Bradway  [birth year]    Assessment:    ICD-9-CM    1. OAB (overactive bladder) 596.51 AMB POC URINALYSIS DIP STICK AUTO W/ MICRO (MICRO RESULTS)       Plan:  1. Continue IT sales professionalmyrbetriq     Chief Complaint   Patient presents with   ??? Urinary Incontinence     History of Present Illness: Charlsie Merlesngeborg B Kramme is a 80 y.o. female who presents today for OAB  Still on myrbetriq 50 mg    Was doing PTNS  But had to move and quit PTNS after 7 treatments  .  myrbetriq   Not causing the dry mouth and doing better  Nocturia x3 n , dysuria n , frequency n , urgency n, hematuria n  Has some mild SUI , urgency   On rare occasion   [facility]      Problem List Date Reviewed: 12/31/2013        ICD-9-CM Class Noted    OAB (overactive bladder) 596.51  08/10/2013    Overview    Signed 08/10/2013  1:54 PM by Alesia BandaKevin W Marypat Kimmet, MD      80 y.o. female who presents today for s/p bladder in the [employer] > 10 years ago Had problems with catheter . Now if under stress c/o OAB Tried Toviaz without any help Now on detrol LA Has some mild SUI , mostly OAB Had urge incontinence once . Marland Kitchen. Has Sjogren's syndrome [710.2] and c/o dry mouth. Will try myrbetriq 25/50 mg Nocturia 3-4             HTN (hypertension) 401.9  11/05/2012        Anemia, unspecified 285.9  11/05/2012        Esophageal reflux 530.81  11/05/2012        DDD (degenerative disc disease), lumbar 722.52  10/30/2012              We are asked to see for evaluation and management by Dr. .       Past Medical History   Diagnosis Date   ??? Sjogren's syndrome    ??? Hypertension    ??? Heart attack    ??? Arthritis    ??? CAD (coronary artery disease)      s/p stents, EF 33%   ??? GERD (gastroesophageal reflux disease)      well controlled   ??? Psychiatric disorder      anxiety   ??? Nausea & vomiting    ??? Chronic kidney disease      renal artery stenosis   ??? Esophageal reflux 11/05/2012   ??? OAB (overactive bladder) 08/10/2013     80 y.o. female who presents today for s/p bladder in the  [facility] > 10 years ago Had problems with catheter . Now if under stress c/o OAB Tried Toviaz without any help Now on detrol LA Has some mild SUI , mostly OAB Had urge incontinence once . Marland Kitchen. Has Sjogren's syndrome [710.2] and c/o dry mouth. Will try myrbetriq 25/50 mg Nocturia 3-4         Past Surgical History   Procedure Laterality Date   ??? Pr cardiac surg procedure unlist       stent x 3   ??? Hx appendectomy     ??? Hx urological       bladder suspension   ??? Hx cataract removal       bilateral   ??? Hx cervical  fusion     ??? Hx orthopaedic       [place]       History     Social History   ??? Marital Status: MARRIED     Spouse Name: N/A     Number of Children: N/A   ??? Years of Education: N/A     Social History Main Topics   ??? Smoking status: Former Smoker   ??? Smokeless tobacco: Never Used   ??? Alcohol Use: Yes      Comment: 1 per week   ??? Drug Use: No   ??? Sexually Active: Not Currently -- Female partner(s)     Other Topics Concern   ??? Not on file     Social History Narrative   ??? No narrative on file       History reviewed. No pertinent family history.    Outpatient Encounter Prescriptions as of 12/31/2013   Medication Sig Dispense Refill   ??? nitroglycerin (NITROSTAT) 0.4 mg SL tablet by SubLINGual route every five (5) minutes as needed for Chest Pain.       ??? atorvastatin (LIPITOR) 40 mg tablet Take  by mouth daily.       ??? oxyCODONE IR (ROXICODONE) 5 mg immediate release tablet Take 5 mg by mouth every four (4) hours as needed for Pain.       ??? esomeprazole (NEXIUM) 40 mg capsule Take  by mouth daily.       ??? traZODone (DESYREL) 50 mg tablet Take  by mouth nightly.       ??? zolpidem (AMBIEN) 5 mg tablet Take  by mouth nightly as needed for Sleep.       ??? ferrous sulfate (IRON) 325 mg (65 mg iron) tablet Take  by mouth Daily (before breakfast).       ??? tolterodine (DETROL) 2 mg tablet Take 2 mg by mouth two (2) times a day.       ??? acetaminophen (TYLENOL EXTRA STRENGTH) 500 mg tablet Take 500 mg by mouth every six (6) hours as needed  for Pain.       ??? CALCIUM CARBONATE/VITAMIN D3 (CALCIUM 600 + D,3, PO) Take  by mouth daily.       ??? hydroxychloroquine (PLAQUENIL) 200 mg tablet Take 200 mg by mouth daily.       ??? magnesium 200 mg Tab Take  by mouth daily.       ??? carvedilol (COREG CR) 20 mg CR capsule Take 20 mg by mouth daily (with breakfast).       ??? cevimeline (EVOXAC) 30 mg capsule Take 30 mg by mouth three (3) times daily. Indications: XEROSTOMIA SECONDARY TO SJOGREN'S SYNDROME       ??? telmisartan (MICARDIS) 20 mg tablet Take 20 mg by mouth daily. Indications: HYPERTENSION       ??? cycloSPORINE (RESTASIS) 0.05 % ophthalmic emulsion Administer 1 Drop to both eyes two (2) times a day.       ??? mirabegron (MYRBETRIQ) 50 mg Tb24 Take 50 mg by mouth daily.  90 tablet  3   ??? mirabegron (MYRBETRIQ) 50 mg Tb24 Take 50 mg by mouth daily.  90 tablet  3     No facility-administered encounter medications on file as of 12/31/2013.       Allergies   Allergen Reactions   ??? Codeine Other (comments)     Gets very hyper       Review of Systems  Constitutional: Fever:   Skin: Rash:  HEENT: Hearing difficulty:   Eyes: Blurred vision:   Cardiovascular: Chest pain:   Respiratory: Shortness of breath:   Gastrointestinal: Nausea/vomiting:   Musculoskeletal: Back pain:   Neurological: Weakness:   Psychological: Memory loss:   Comments/additional findings:   All other systems reviewed and are negative         Physical Exam:  Ht 5\' 1"  (1.549 m)   Wt 93 lb (42.185 kg)   BMI 17.58 kg/m2  Constitutional: Well developed, well-nourished female in no acute distress.   CV:  No peripheral swelling noted  Respiratory: No respiratory distress or difficulties  Abdomen:  Soft and nontender. No masses. No hepatosplenomegaly.   GU Female:  No CVA tenderness.   Skin:  Normal color. No evidence of jaundice.     Neuro/Psych:  Patient with appropriate affect.  Alert and oriented.    Lymphatic:   No enlargement of supraclavicular lymph nodes.      UA:   Results for orders placed in visit  on 12/31/13   AMB POC URINALYSIS DIP STICK AUTO W/ MICRO (MICRO RESULTS)       Result Value Range    Color (UA POC) Yellow      Clarity (UA POC) Clear      Glucose (UA POC) Negative  Negative    Bilirubin (UA POC) Negative  Negative    Ketones (UA POC) Negative  Negative    Specific gravity (UA POC) 1.015  1.001 - 1.035    Blood (UA POC) Trace  Negative    pH (UA POC) 6.5  4.6 - 8.0    Protein (UA POC) Negative  Negative mg/dL    Urobilinogen (UA POC) 0.2 mg/dL  0.2 - 1    Nitrites (UA POC) Negative  Negative    Leukocyte esterase (UA POC) Negative  Negative    Epithelial cells (UA POC)        WBCs (UA POC) n      RBCs (UA POC) n      Bacteria (UA POC) n      Crystals (UA POC)    Negative    Other (UA POC)           Alesia Banda, MD on 12/31/2013

## 2014-02-09 NOTE — Telephone Encounter (Signed)
Pt would like to increase the mg of her rx

## 2014-02-16 LAB — AMB POC URINALYSIS DIP STICK AUTO W/ MICRO (MICRO RESULTS)
Bacteria (UA POC): NEGATIVE
Bilirubin (UA POC): NEGATIVE
Glucose (UA POC): NEGATIVE
Ketones (UA POC): NEGATIVE
Leukocyte esterase (UA POC): NEGATIVE
Nitrites (UA POC): NEGATIVE
Protein (UA POC): NEGATIVE mg/dL
RBCs (UA POC): NEGATIVE
Specific gravity (UA POC): 1.015 (ref 1.001–1.035)
Urobilinogen (UA POC): 0.2 (ref 0.2–1)
WBCs (UA POC): NEGATIVE
pH (UA POC): 7.5 (ref 4.6–8.0)

## 2014-02-16 MED ORDER — ESTRADIOL 0.01% (0.1 MG/G) VAGINAL CREAM
0.01 % (0.1 mg/gram) | VAGINAL | Status: DC
Start: 2014-02-16 — End: 2015-05-18

## 2014-02-16 NOTE — Progress Notes (Signed)
Marissa Harrell  [birth year]    Assessment:    ICD-9-CM    1. OAB (overactive bladder) 596.51 AMB POC URINALYSIS DIP STICK AUTO W/ MICRO (MICRO RESULTS)       Plan:  1. Estrace   3x/ week   Discussed how any anticholinergic and her sjogren's make her mouth dry     Chief Complaint   Patient presents with   ??? Overactive Bladder     discuss medication changes PTNS did not work for patient     History of Present Illness: Marissa Merlesngeborg B Borge is a 80 y.o. female who presents today for   Having a lot of stress since sold her house states .   Nocturia 3-4  And urgency / occ urge incontinence  , dysuria n , frequency y , urgency y, hematuria n      Problem List Date Reviewed: 02/16/2014        ICD-9-CM Class Noted     OAB (overactive bladder) 596.51  08/10/2013    Overview     Signed 08/10/2013  1:54 PM by Alesia BandaKevin W Jasha Hodzic, MD     80 y.o. female who presents today for s/p bladder [facility] > 10 years ago Had problems with catheter . Now if under stress c/o OAB Tried Toviaz without any help Now on detrol LA Has some mild SUI , mostly OAB Had urge incontinence once . Marland Kitchen. Has Sjogren's syndrome [710.2] and c/o dry mouth. Will try myrbetriq 25/50 mg Nocturia 3-4              HTN (hypertension) 401.9  11/05/2012         Anemia, unspecified 285.9  11/05/2012         Esophageal reflux 530.81  11/05/2012         DDD (degenerative disc disease), lumbar 722.52  10/30/2012              We are asked to see for evaluation and management by Dr. .       Past Medical History   Diagnosis Date   ??? Sjogren's syndrome    ??? Hypertension    ??? Heart attack    ??? Arthritis    ??? CAD (coronary artery disease)      s/p stents, EF 33%   ??? GERD (gastroesophageal reflux disease)      well controlled   ??? Psychiatric disorder      anxiety   ??? Nausea & vomiting    ??? Chronic kidney disease      renal artery stenosis   ??? Esophageal reflux 11/05/2012   ??? OAB (overactive bladder) 08/10/2013     80 y.o. female who presents today for s/p bladder [facility] > 10 years ago  Had problems with catheter . Now if under stress c/o OAB Tried Toviaz without any help Now on detrol LA Has some mild SUI , mostly OAB Had urge incontinence once . Marland Kitchen. Has Sjogren's syndrome [710.2] and c/o dry mouth. Will try myrbetriq 25/50 mg Nocturia 3-4         Past Surgical History   Procedure Laterality Date   ??? Pr cardiac surg procedure unlist       stent x 3   ??? Hx appendectomy     ??? Hx urological       bladder suspension   ??? Hx cataract removal       bilateral   ??? Hx cervical fusion     ???  Hx orthopaedic       [place]       History     Social History   ??? Marital Status: MARRIED     Spouse Name: N/A     Number of Children: N/A   ??? Years of Education: N/A     Social History Main Topics   ??? Smoking status: Former Smoker   ??? Smokeless tobacco: Never Used   ??? Alcohol Use: Yes      Comment: 1 per week   ??? Drug Use: No   ??? Sexual Activity:     Partners: Male     Other Topics Concern   ??? Not on file     Social History Narrative       History reviewed. No pertinent family history.    Outpatient Encounter Prescriptions as of 02/16/2014   Medication Sig Dispense Refill   ??? mirabegron (MYRBETRIQ) 50 mg Tb24 Take 50 mg by mouth daily.  90 tablet  3   ??? mirabegron (MYRBETRIQ) 50 mg Tb24 Take 50 mg by mouth daily.  90 tablet  3   ??? nitroglycerin (NITROSTAT) 0.4 mg SL tablet by SubLINGual route every five (5) minutes as needed for Chest Pain.       ??? atorvastatin (LIPITOR) 40 mg tablet Take  by mouth daily.       ??? oxyCODONE IR (ROXICODONE) 5 mg immediate release tablet Take 5 mg by mouth every four (4) hours as needed for Pain.       ??? esomeprazole (NEXIUM) 40 mg capsule Take  by mouth daily.       ??? traZODone (DESYREL) 50 mg tablet Take  by mouth nightly.       ??? zolpidem (AMBIEN) 5 mg tablet Take  by mouth nightly as needed for Sleep.       ??? ferrous sulfate (IRON) 325 mg (65 mg iron) tablet Take  by mouth Daily (before breakfast).       ??? tolterodine (DETROL) 2 mg tablet Take 2 mg by mouth two (2) times a day.       ???  acetaminophen (TYLENOL EXTRA STRENGTH) 500 mg tablet Take 500 mg by mouth every six (6) hours as needed for Pain.       ??? CALCIUM CARBONATE/VITAMIN D3 (CALCIUM 600 + D,3, PO) Take  by mouth daily.       ??? hydroxychloroquine (PLAQUENIL) 200 mg tablet Take 200 mg by mouth daily.       ??? magnesium 200 mg Tab Take  by mouth daily.       ??? carvedilol (COREG CR) 20 mg CR capsule Take 20 mg by mouth daily (with breakfast).       ??? cevimeline (EVOXAC) 30 mg capsule Take 30 mg by mouth three (3) times daily. Indications: XEROSTOMIA SECONDARY TO SJOGREN'S SYNDROME       ??? telmisartan (MICARDIS) 20 mg tablet Take 20 mg by mouth daily. Indications: HYPERTENSION       ??? cycloSPORINE (RESTASIS) 0.05 % ophthalmic emulsion Administer 1 Drop to both eyes two (2) times a day.         No facility-administered encounter medications on file as of 02/16/2014.       Allergies   Allergen Reactions   ??? Codeine Other (comments)     Gets very hyper       Review of Systems  Constitutional: Fever: No  Skin: Rash: No  HEENT: Hearing difficulty: No  Eyes: Blurred vision: No  Cardiovascular:  Chest pain: No  Respiratory: Shortness of breath: No  Gastrointestinal: Nausea/vomiting: No  Musculoskeletal: Back pain: Yes  Neurological: Weakness: Yes  Psychological: Memory loss: Yes  Comments/additional findings:   All other systems reviewed and are negative         Physical Exam:  BP 110/70    Ht 5\' 1"  (1.549 m)    Wt 97 lb (43.999 kg)    BMI 18.34 kg/m2     Constitutional: Well developed, well-nourished female in no acute distress.   CV:  No peripheral swelling noted  Respiratory: No respiratory distress or difficulties  Abdomen:  Soft and nontender. No masses. No hepatosplenomegaly.   GU Female:  No CVA tenderness.   Skin:  Normal color. No evidence of jaundice.     Neuro/Psych:  Patient with appropriate affect.  Alert and oriented.    Lymphatic:   No enlargement of supraclavicular lymph nodes.      UA:   Results for orders placed in visit on 02/16/14    AMB POC URINALYSIS DIP STICK AUTO W/ MICRO (MICRO RESULTS)       Result Value Ref Range    Color (UA POC) Yellow      Clarity (UA POC) Clear      Glucose (UA POC) Negative  Negative    Bilirubin (UA POC) Negative  Negative    Ketones (UA POC) Negative  Negative    Specific gravity (UA POC) 1.015  1.001 - 1.035    Blood (UA POC) Trace  Negative    pH (UA POC) 7.5  4.6 - 8.0    Protein (UA POC) Negative  Negative mg/dL    Urobilinogen (UA POC) 0.2 mg/dL  0.2 - 1    Nitrites (UA POC) Negative  Negative    Leukocyte esterase (UA POC) Negative  Negative    Epithelial cells (UA POC)        WBCs (UA POC) n      RBCs (UA POC) n      Bacteria (UA POC) n      Crystals (UA POC)    Negative    Other (UA POC)           Alesia Banda, MD on 02/16/2014

## 2014-02-16 NOTE — Progress Notes (Signed)
AUA Assessment Score:  AUA Score: 9;    AUA Bother Rating: Unhappy  BP 110/70    Ht 5\' 1"  (1.549 m)    Wt 97 lb (43.999 kg)    BMI 18.34 kg/m2

## 2015-05-18 ENCOUNTER — Emergency Department: Admit: 2015-05-18 | Payer: MEDICARE | Primary: Family Medicine

## 2015-05-18 ENCOUNTER — Inpatient Hospital Stay
Admit: 2015-05-18 | Discharge: 2015-05-18 | Disposition: A | Discharge status: Home / Self Care | Payer: MEDICARE | Attending: Emergency Medicine

## 2015-05-18 DIAGNOSIS — R197 Diarrhea, unspecified: Secondary | ICD-10-CM

## 2015-05-18 LAB — URINALYSIS W/ RFLX MICROSCOPIC
Bilirubin: NEGATIVE
Glucose: NEGATIVE mg/dL
Ketone: NEGATIVE mg/dL
Nitrites: NEGATIVE
Protein: NEGATIVE mg/dL
Specific gravity: 1.007 (ref 1.005–1.030)
Urobilinogen: 0.2 EU/dL (ref 0.2–1.0)
pH (UA): 5.5 (ref 5.0–8.0)

## 2015-05-18 LAB — METABOLIC PANEL, COMPREHENSIVE
A-G Ratio: 1 (ref 0.8–1.7)
ALT (SGPT): 33 U/L (ref 13–56)
AST (SGOT): 27 U/L (ref 15–37)
Albumin: 3.7 g/dL (ref 3.4–5.0)
Alk. phosphatase: 38 U/L — ABNORMAL LOW (ref 45–117)
Anion gap: 10 mmol/L (ref 3.0–18)
BUN/Creatinine ratio: 23 — ABNORMAL HIGH (ref 12–20)
BUN: 14 MG/DL (ref 7.0–18)
Bilirubin, total: 0.6 MG/DL (ref 0.2–1.0)
CO2: 25 mmol/L (ref 21–32)
Calcium: 8.5 MG/DL (ref 8.5–10.1)
Chloride: 102 mmol/L (ref 100–108)
Creatinine: 0.6 MG/DL (ref 0.6–1.3)
GFR est AA: >60 mL/min/{1.73_m2} (ref >60)
GFR est non-AA: >60 mL/min/{1.73_m2} (ref >60)
Globulin: 3.6 g/dL (ref 2.0–4.0)
Glucose: 92 mg/dL (ref 74–99)
Potassium: 4.1 mmol/L (ref 3.5–5.5)
Protein, total: 7.3 g/dL (ref 6.4–8.2)
Sodium: 137 mmol/L (ref 136–145)

## 2015-05-18 LAB — CBC WITH AUTOMATED DIFF
ABS. BASOPHILS: 0 10*3/uL (ref 0.0–0.06)
ABS. EOSINOPHILS: 0.1 10*3/uL (ref 0.0–0.4)
ABS. LYMPHOCYTES: 1.6 10*3/uL (ref 0.9–3.6)
ABS. MONOCYTES: 0.5 10*3/uL (ref 0.05–1.2)
ABS. NEUTROPHILS: 4 10*3/uL (ref 1.8–8.0)
BASOPHILS: 1 % (ref 0–2)
EOSINOPHILS: 2 % (ref 0–5)
HCT: 36.9 % (ref 35.0–45.0)
HGB: 12.3 g/dL (ref 12.0–16.0)
LYMPHOCYTES: 26 % (ref 21–52)
MCH: 31.3 PG (ref 24.0–34.0)
MCHC: 33.3 g/dL (ref 31.0–37.0)
MCV: 93.9 FL (ref 74.0–97.0)
MONOCYTES: 8 % (ref 3–10)
MPV: 10 FL (ref 9.2–11.8)
NEUTROPHILS: 63 % (ref 40–73)
PLATELET: 238 10*3/uL (ref 135–420)
RBC: 3.93 M/uL — ABNORMAL LOW (ref 4.20–5.30)
RDW: 13.9 % (ref 11.6–14.5)
WBC: 6.2 10*3/uL (ref 4.6–13.2)

## 2015-05-18 LAB — URINE MICROSCOPIC ONLY
RBC: 0 /hpf (ref 0–5)
WBC: 1 /hpf (ref 0–5)

## 2015-05-18 MED ORDER — SODIUM CHLORIDE 0.9% BOLUS IV
0.9 % | Freq: Once | INTRAVENOUS | Status: AC
Start: 2015-05-18 — End: 2015-05-18
  Administered 2015-05-18: 14:00:00 via INTRAVENOUS

## 2015-05-18 MED ORDER — ONDANSETRON (PF) 4 MG/2 ML INJECTION
4 mg/2 mL | INTRAMUSCULAR | Status: AC
Start: 2015-05-18 — End: 2015-05-18
  Administered 2015-05-18: 14:00:00 via INTRAVENOUS

## 2015-05-18 MED ORDER — ONDANSETRON 4 MG TAB, RAPID DISSOLVE
4 mg | ORAL_TABLET | Freq: Three times a day (TID) | ORAL | Status: DC | PRN
Start: 2015-05-18 — End: 2016-07-28

## 2015-05-18 MED FILL — SODIUM CHLORIDE 0.9 % IV: INTRAVENOUS | Qty: 500

## 2015-05-18 MED FILL — ONDANSETRON (PF) 4 MG/2 ML INJECTION: 4 mg/2 mL | INTRAMUSCULAR | Qty: 2

## 2015-05-18 NOTE — ED Notes (Signed)
[facility]. Patient states that she has had diarrhea for over 1 week and is nauseated. Sepsis Screening completed    (  )Patient meets SIRS criteria.  ( x )Patient does not meet SIRS criteria.      SIRS Criteria is achieved when two or more of the following are present  ? Temperature < 96.8??F (36??C) or > 100.9??F (38.3??C)  ? Heart Rate > 90 beats per minute  ? Respiratory Rate > 20 beats per minute  ? WBC count > 12,000 or <4,000 or > 10% bands      (  )Patient has a suspected source of infection.  (x  )Patient does not have a suspected source of infection.

## 2015-05-18 NOTE — ED Notes (Signed)
Bedside shift change report given to A. Rana SnareLowe, RN (Cabin crewoncoming nurse) by A. Fox-Bailey, RN Physiological scientist(offgoing nurse). Report included the following information SBAR, ED Summary, Intake/Output, MAR and Recent Results, and contact isolation.

## 2015-05-18 NOTE — ED Notes (Signed)
Patient greeted / introduced myself as their primary nurse. Patient advised that medical information will be discussed at this time and it is their own responsibility to let nurse know if such conversation should not take place in presence of visitors. Pt expressed understanding. Encouraged to voice any concerns, and all questions/concerns addressed.  Assessment in progress. Explanation and teaching of all care given,?? including any pending orders or procedures. Awaiting further MD orders at this time. Patient fall risk assessed, with prevention measures in place, to include bed in lowest position with casters locked and rail up, call bell within reach, lights on, pathway to bathroom free from obstacles. Patient instructed to call for assist OOB at all times. Floor dry. Slip resistant socks offered if needed.?? Instructed that staff will make hourly rounds to provide reassessments in pain control, concerns, toileting, and any other updates in care. Hand hygiene maintained prior to and after patient/staff interaction.

## 2015-05-18 NOTE — ED Notes (Signed)
Pt hourly rounding competed.  Safety   Pt ()x resting on stretcher with side rails up and call bell in reach.    () in chair    () in parents arms.  Toileting   Pt offered ()Bedpan     ()Assistance to Restroom     ()Urinal  Ongoing Updates  Updated on plan of care and status of test results.  Pain Management  Inquired as to comfort and offered comfort measures:    () warm blankets   () dimmed lights

## 2015-05-18 NOTE — ED Provider Notes (Signed)
HPI Comments: 9:54 AM    Marissa Harrell is a 81 y.o. female with a pmhx of CAD, Nausea and vomiting, CKD, GERD, who presents to the ED with c/o nausea, vomiting, and diarrhea x 1 week. Patient, who resides at [facility], is currently under quarantine for the Norovirus. She states driving herself here due to having an onset of nausea, vomiting, and diarrhea over the past week which she notes worsening last night and this AM. Patient states having multiple onsets of diarrhea through all night last night and onset of diarrhea this morning which she states had "come really fast." She denies any black tarry stools. No blood in stools. She notes having a decreased appetite due to feeling nauseous at this time. Denies eating anything this morning but ate minimally yesterday evening. No fevers or abdominal pain. No hx of CHF. No other symptoms or complaints were presented at this time.     Written by Sherrilyn Rist, ED Scribe, as dictated by Sherilyn Banker, PA-C.    Patient is a 81 y.o. female presenting with diarrhea and nausea. The history is provided by the patient.   Diarrhea   This is a new problem. The current episode started more than 2 days ago (1 week). The problem has been rapidly worsening. Associated symptoms include diarrhea and nausea. Pertinent negatives include no fever.   Nausea   Associated symptoms include diarrhea. Pertinent negatives include no fever and no abdominal pain.        Past Medical History:   Diagnosis Date   ??? Sjogren's syndrome ([place])    ??? Hypertension    ??? Heart attack ([place])    ??? Arthritis    ??? CAD (coronary artery disease)      s/p stents, EF 33%   ??? GERD (gastroesophageal reflux disease)      well controlled   ??? Psychiatric disorder      anxiety   ??? Nausea & vomiting    ??? Chronic kidney disease      renal artery stenosis   ??? Esophageal reflux 11/05/2012   ??? OAB (overactive bladder) 08/10/2013     81 y.o. female who presents today for s/p bladder in the [employer] > 10 years  ago Had problems with catheter . Now if under stress c/o OAB Tried Toviaz without any help Now on detrol LA Has some mild SUI , mostly OAB Had urge incontinence once . Marland Kitchen Has Sjogren's syndrome [710.2] and c/o dry mouth. Will try myrbetriq 25/50 mg Nocturia 3-4         Past Surgical History:   Procedure Laterality Date   ??? Pr cardiac surg procedure unlist       stent x 3   ??? Hx appendectomy     ??? Hx urological       bladder suspension   ??? Hx cataract removal       bilateral   ??? Hx cervical fusion     ??? Hx orthopaedic       [place]         History reviewed. No pertinent family history.    History     Social History   ??? Marital Status: MARRIED     Spouse Name: N/A   ??? Number of Children: N/A   ??? Years of Education: N/A     Occupational History   ??? Not on file.     Social History Main Topics   ??? Smoking status: Former Smoker   ???  Smokeless tobacco: Never Used   ??? Alcohol Use: Yes      Comment: 1 per week   ??? Drug Use: No   ??? Sexual Activity:     Partners: Male     Other Topics Concern   ??? Not on file     Social History Narrative         ALLERGIES: Codeine    Review of Systems   Constitutional: Positive for appetite change (decreased). Negative for fever.   Gastrointestinal: Positive for nausea and diarrhea. Negative for abdominal pain.   All other systems reviewed and are negative.      Filed Vitals:    05/18/15 0937 05/18/15 1123 05/18/15 1312   BP: 155/81 111/70 163/82   Pulse: 72 71 79   Temp: 98 ??F (36.7 ??C)     Resp: _0 Height: _1  (1.549 m)     Weight: 43.999 kg (97 lb)     SpO2: 100% 100% 98%            Physical Exam   Nursing note and vitals reviewed.   Vital signs and nursing notes reviewed.    CONSTITUTIONAL: Alert. Well-appearing; thin pleasant elderly female; in no apparent distress.  HEAD: Normocephalic; atraumatic.  EYES: PERRL; Conjunctiva clear.   ENT: Dry mucus membranes.  NECK: Supple; FROM without difficulty, non-tender; no cervical lymphadenopathy.              CV: Normal S1, S2; no murmurs, rubs, or gallops. No chest wall tenderness.  RESPIRATORY: Normal chest excursion with respiration; breath sounds clear and equal bilaterally; no wheezes, rhonchi, or rales.  GI: Hyperactive bowel sounds; non-distended; mild generalized lower abd tenderness; no guarding or rigidity; no palpable organomegaly. No CVA tenderness.   BACK:  No evidence of trauma or deformity. Non-tender to palpation.  EXT: Normal ROM in all four extremities; non-tender to palpation.  SKIN: Normal for age and race; warm; dry; good turgor; no apparent lesions or exudate.  NEURO: A & O x3.   PSYCH:  Mood and affect appropriate.     RESULTS:    XR ABD FLAT/ ERECT   Preliminary Result   Impression:  Nonobstructive bowel gas pattern. No evidence of acute abnormality. ??  ??  As read by the radiologist.        Labs Reviewed   CBC WITH AUTOMATED DIFF - Abnormal; Notable for the following:     RBC 3.93 (*)     All other components within normal limits   METABOLIC PANEL, COMPREHENSIVE - Abnormal; Notable for the following:     BUN/Creatinine ratio 23 (*)     Alk. phosphatase 38 (*)     All other components within normal limits   URINALYSIS W/ RFLX MICROSCOPIC - Abnormal; Notable for the following:     Blood TRACE (*)     Leukocyte Esterase SMALL (*)     All other components within normal limits   URINE MICROSCOPIC ONLY - Abnormal; Notable for the following:     Bacteria FEW (*)     All other components within normal limits   CULTURE, URINE       Recent Results (from the past 12 hour(s))   CBC WITH AUTOMATED DIFF    Collection Time: 05/18/15 10:15 AM   Result Value Ref Range    WBC 6.2 4.6 - 13.2 K/uL    RBC 3.93 (L) 4.20 - 5.30 M/uL    HGB 12.3 12.0 - 16.0 g/dL  HCT 36.9 35.0 - 45.0 %    MCV 93.9 74.0 - 97.0 FL    MCH 31.3 24.0 - 34.0 PG    MCHC 33.3 31.0 - 37.0 g/dL    RDW 13.9 11.6 - 14.5 %    PLATELET 238 135 - 420 K/uL    MPV 10.0 9.2 - 11.8 FL    NEUTROPHILS 63 40 - 73 %    LYMPHOCYTES 26 21 - 52 %     MONOCYTES 8 3 - 10 %    EOSINOPHILS 2 0 - 5 %    BASOPHILS 1 0 - 2 %    ABS. NEUTROPHILS 4.0 1.8 - 8.0 K/UL    ABS. LYMPHOCYTES 1.6 0.9 - 3.6 K/UL    ABS. MONOCYTES 0.5 0.05 - 1.2 K/UL    ABS. EOSINOPHILS 0.1 0.0 - 0.4 K/UL    ABS. BASOPHILS 0.0 0.0 - 0.06 K/UL    DF AUTOMATED     METABOLIC PANEL, COMPREHENSIVE    Collection Time: 05/18/15 10:15 AM   Result Value Ref Range    Sodium 137 136 - 145 mmol/L    Potassium 4.1 3.5 - 5.5 mmol/L    Chloride 102 100 - 108 mmol/L    CO2 25 21 - 32 mmol/L    Anion gap 10 3.0 - 18 mmol/L    Glucose 92 74 - 99 mg/dL    BUN 14 7.0 - 18 MG/DL    Creatinine 0.60 0.6 - 1.3 MG/DL    BUN/Creatinine ratio 23 (H) 12 - 20      GFR est AA >60 >60 ml/min/1.60m    GFR est non-AA >60 >60 ml/min/1.72m   Calcium 8.5 8.5 - 10.1 MG/DL    Bilirubin, total 0.6 0.2 - 1.0 MG/DL    ALT 33 13 - 56 U/L    AST 27 15 - 37 U/L    Alk. phosphatase 38 (L) 45 - 117 U/L    Protein, total 7.3 6.4 - 8.2 g/dL    Albumin 3.7 3.4 - 5.0 g/dL    Globulin 3.6 2.0 - 4.0 g/dL    A-G Ratio 1.0 0.8 - 1.7     URINALYSIS W/ RFLX MICROSCOPIC    Collection Time: 05/18/15 11:15 AM   Result Value Ref Range    Color YELLOW      Appearance CLEAR      Specific gravity 1.007 1.005 - 1.030      pH (UA) 5.5 5.0 - 8.0      Protein NEGATIVE  NEG mg/dL    Glucose NEGATIVE  NEG mg/dL    Ketone NEGATIVE  NEG mg/dL    Bilirubin NEGATIVE  NEG      Blood TRACE (A) NEG      Urobilinogen 0.2 0.2 - 1.0 EU/dL    Nitrites NEGATIVE  NEG      Leukocyte Esterase SMALL (A) NEG     URINE MICROSCOPIC ONLY    Collection Time: 05/18/15 11:15 AM   Result Value Ref Range    WBC 1 to 4 0 - 5 /hpf    RBC 0 to 2 0 - 5 /hpf    Epithelial cells FEW 0 - 5 /lpf    Bacteria FEW (A) NEG /hpf       MDM  Number of Diagnoses or Management Options  Diagnosis management comments: DDX:       Medications   sodium chloride 0.9 % bolus infusion 500 mL (0 mL IntraVENous IV Completed 05/18/15 1116)  ondansetron (ZOFRAN) injection 4 mg (4 mg IntraVENous Given 05/18/15 1018)        Procedures    PROGRESS NOTE:  9:54 AM  Initial assessment performed.  Written by Sherrilyn Rist, ED Scribe, as dictated by Sherilyn Banker, PA-C.    PROGRESS NOTE:   11:12 AM  Pt has been re-examined by Carmelina Noun, PA. Pt feeling better, resting comfortably, no longer nauseated. Has not been able to provide stool or urine sample yet.   Written by Carmelina Noun, PA.    PROGRESS NOTE:  12:27 PM  Patient is feeling better and has had no episodes of diarrhea. Therefore, unable to obtain stool sample. Advised patient to take Imodium and will prescribe Zofran for patient to use as needed for nausea. Will culture urine prior to treating her with abx.   Written by Sherrilyn Rist, ED Scribe, as dictated by Sherilyn Banker, PA-C.    DISCHARGE NOTE:  12:28 PM  Jamila B Alonzo's  results have been reviewed with her.  She has been counseled regarding her diagnosis, treatment, and plan.  She verbally conveys understanding and agreement of the signs, symptoms, diagnosis, treatment and prognosis and additionally agrees to follow up as discussed.  She also agrees with the care-plan and conveys that all of her questions have been answered.  I have also provided discharge instructions for her that include: educational information regarding their diagnosis and treatment, and list of reasons why they would want to return to the ED prior to their follow-up appointment, should her condition change.    The patient and/or family have been provided with education for proper Emergency Department utilization.    CLINICAL IMPRESSION:    1. Diarrhea    2. Nausea without vomiting        PLAN: DISCHARGE HOME    Follow-up Information     Follow up With Details Comments [place], MD Call in 2 days or your regular primary care physician [place] [address]  [phone]      [facility] EMERGENCY DEPT  As needed, If symptoms worsen [address]   [place] [address]  [phone]          Discharge Medication List as of 05/18/2015  1:04 PM      START taking these medications    Details   ondansetron (ZOFRAN ODT) 4 mg disintegrating tablet Take 1 Tab by mouth every eight (8) hours as needed for Nausea., Print, Disp-12 Tab, R-0         CONTINUE these medications which have NOT CHANGED    Details   !! mirabegron (MYRBETRIQ) 50 mg Tb24 Take 50 mg by mouth daily., Print, Disp-90 tablet, R-3      atorvastatin (LIPITOR) 40 mg tablet Take  by mouth daily., Historical Med      esomeprazole (NEXIUM) 40 mg capsule Take  by mouth daily., Historical Med      traZODone (DESYREL) 50 mg tablet Take  by mouth nightly., Historical Med      CALCIUM CARBONATE/VITAMIN D3 (CALCIUM 600 + D,3, PO) Take  by mouth daily., Historical Med      hydroxychloroquine (PLAQUENIL) 200 mg tablet Take 200 mg by mouth daily., Historical Med      magnesium 200 mg Tab Take  by mouth daily., Historical Med      carvedilol (COREG CR) 20 mg CR capsule Take 20 mg by mouth  daily (with breakfast)., Historical Med      cevimeline (EVOXAC) 30 mg capsule Take 30 mg by mouth three (3) times daily. Indications: XEROSTOMIA SECONDARY TO SJOGREN'S SYNDROME, Historical Med      telmisartan (MICARDIS) 20 mg tablet Take 20 mg by mouth daily. Indications: HYPERTENSION, Historical Med      cycloSPORINE (RESTASIS) 0.05 % ophthalmic emulsion Administer 1 Drop to both eyes two (2) times a day., Historical Med      !! mirabegron (MYRBETRIQ) 50 mg Tb24 Take 50 mg by mouth daily., Print, Disp-90 tablet, R-3      nitroglycerin (NITROSTAT) 0.4 mg SL tablet by SubLINGual route every five (5) minutes as needed for Chest Pain., Historical Med      oxyCODONE IR (ROXICODONE) 5 mg immediate release tablet Take 5 mg by mouth every four (4) hours as needed for Pain., Historical Med      zolpidem (AMBIEN) 5 mg tablet Take  by mouth nightly as needed for Sleep., Historical Med       ferrous sulfate (IRON) 325 mg (65 mg iron) tablet Take  by mouth Daily (before breakfast)., Historical Med      tolterodine (DETROL) 2 mg tablet Take 2 mg by mouth two (2) times a day., Historical Med      acetaminophen (TYLENOL EXTRA STRENGTH) 500 mg tablet Take 500 mg by mouth every six (6) hours as needed for Pain., Historical Med       !! - Potential duplicate medications found. Please discuss with provider.          SCRIBE ATTESTATION:    This note was prepared by Haze Justin acting as Scribe for and in the presence of [employer], [employer].     Sherilyn Banker, PA-C: The scribe's documentation has been prepared under my direction and personally reviewed by me in its entirety. I confirm that the note above accurately reflects all work, treatment, procedures, and medical decision making performed by me.    Written by Sherrilyn Rist, ED Scribe, as dictated by Sherilyn Banker, PA-C.

## 2015-05-18 NOTE — ED Notes (Signed)
This RN at bedside with patient at this time assisting patient to the restroom

## 2015-05-18 NOTE — ED Notes (Signed)
Patient unable to provide stool sample at this time

## 2015-05-18 NOTE — ED Notes (Signed)
Pt hourly rounding competed.  Safety   Pt (x) resting on stretcher with side rails up and call bell in reach.    () in chair    () in parents arms.  Toileting   Pt offered ()Bedpan     ()Assistance to Restroom     ()Urinal  Ongoing Updates  Updated on plan of care and status of test results.  Pain Management  Inquired as to comfort and offered comfort measures:    () warm blankets   () dimmed lights

## 2015-05-18 NOTE — ED Notes (Signed)
Bedside change of shift report received from Gae GallopAmy F. Bailey RN. Included SBAR and MAR

## 2015-05-18 NOTE — ED Notes (Signed)
I have reviewed discharge instructions with the patient.  The patient verbalized understanding.  Patient armband removed and shredded

## 2015-05-20 LAB — CULTURE, URINE
Culture result:: <10000
Culture result:: <10000
Culture: <10000
Culture: <10000

## 2015-09-21 NOTE — Progress Notes (Signed)
Patient called requesting a refill on Myrbetriq but she has not been seen since March 2015 so I left her a voicemail letting her know she needs to make a follow up appointment with Dr. [name] before we can refill her medication.

## 2015-11-08 ENCOUNTER — Inpatient Hospital Stay
Admit: 2015-11-08 | Discharge: 2015-11-08 | Disposition: A | Discharge status: Home / Self Care | Payer: MEDICARE | Attending: Emergency Medicine

## 2015-11-08 ENCOUNTER — Emergency Department: Admit: 2015-11-08 | Payer: MEDICARE | Primary: Family Medicine

## 2015-11-08 DIAGNOSIS — S62102A Fracture of unspecified carpal bone, left wrist, initial encounter for closed fracture: Secondary | ICD-10-CM

## 2015-11-08 MED ORDER — HYDROCODONE-ACETAMINOPHEN 5 MG-325 MG TAB
5-325 mg | ORAL | Status: AC
Start: 2015-11-08 — End: 2015-11-08
  Administered 2015-11-08: 15:00:00 via ORAL

## 2015-11-08 MED FILL — HYDROCODONE-ACETAMINOPHEN 5 MG-325 MG TAB: 5-325 mg | ORAL | Qty: 1

## 2015-11-08 NOTE — ED Notes (Signed)
Wrist immobilizer applied,  Pt states that it feels much better in splint, more supported

## 2015-11-08 NOTE — ED Triage Notes (Addendum)
C/o left hand pain and swelling after tripping and falling yesterday, denies other injuries or hitting her head. CNS and left radial pulse palpable.

## 2015-11-08 NOTE — ED Provider Notes (Signed)
HPI Comments:   9:16 AM   Marissa Harrell is a 81 y.o. female presenting with friend to the ED C/O left hand and wrist pain/swelling s/p mechanical trip/fall that occurred while at grocery store yesterday. No tobacco, alcohol, or illicit drug use. Pt denies head impact/CHI, LOC, chest pain, lightheadedness/dizziness, back pain, neck pain, and any other Sx or complaints.      Patient is a 81 y.o. female presenting with hand pain. The history is provided by the patient. No language interpreter was used.   Hand Pain    This is a new problem. The current episode started yesterday. The pain is present in the left hand. Pertinent negatives include no back pain and no neck pain. There has been a history of trauma.        Past Medical History:   Diagnosis Date   ??? Arthritis    ??? CAD (coronary artery disease)      s/p stents, EF 33%   ??? Chronic kidney disease      renal artery stenosis   ??? Esophageal reflux 11/05/2012   ??? GERD (gastroesophageal reflux disease)      well controlled   ??? Heart attack (HCC)    ??? Hypertension    ??? Nausea & vomiting    ??? OAB (overactive bladder) 08/10/2013     81 y.o. female who presents today for s/p bladder in the [employer] > 10 years ago Had problems with catheter . Now if under stress c/o OAB Tried Toviaz without any help Now on detrol LA Has some mild SUI , mostly OAB Had urge incontinence once . Marland Kitchen Has Sjogren's syndrome [710.2] and c/o dry mouth. Will try myrbetriq 25/50 mg Nocturia 3-4     ??? Psychiatric disorder      anxiety   ??? Sjogren's syndrome [facility])        Past Surgical History:   Procedure Laterality Date   ??? Pr cardiac surg procedure unlist       stent x 3   ??? Hx appendectomy     ??? Hx urological       bladder suspension   ??? Hx cataract removal       bilateral   ??? Hx cervical fusion     ??? Hx orthopaedic       Elbow         No family history on file.    Social History     Social History   ??? Marital status: MARRIED     Spouse name: N/A   ??? Number of children: N/A    ??? Years of education: N/A     Occupational History   ??? Not on file.     Social History Main Topics   ??? Smoking status: Former Smoker   ??? Smokeless tobacco: Never Used   ??? Alcohol use Yes      Comment: 1 per week   ??? Drug use: No   ??? Sexual activity: Not Currently     Partners: Male     Other Topics Concern   ??? Not on file     Social History Narrative         ALLERGIES: Codeine    Review of Systems   Cardiovascular: Negative for chest pain.   Musculoskeletal: Positive for arthralgias (left hand and wrist), joint swelling (left hand and wrist) and myalgias (left hand and wrist). Negative for back pain and neck pain.   Neurological: Negative for dizziness, syncope and light-headedness.  All other systems reviewed and are negative.      Vitals:    11/08/15 0907 11/08/15 1126   BP: (!) 169/92 170/80   Pulse: 73 78   Resp: 16 18   Temp: 97.5 ??F (36.4 ??C)    SpO2: 100%    Weight: 43.1 kg (95 lb)    Height: 5\' 1"  (1.549 m)             Physical Exam   Constitutional: She is oriented to person, place, and time.  Non-toxic appearance. No distress.   Elderly female, well appearing, nontoxic appearing. NAD. Somewhat lean.    HENT:   Head: Normocephalic and atraumatic.   Right Ear: External ear normal.   Left Ear: External ear normal.   Mouth/Throat: Oropharynx is clear and moist. No oropharyngeal exudate.   Eyes: Conjunctivae and EOM are normal. Pupils are equal, round, and reactive to light. No scleral icterus.   No pallor   Neck: Normal range of motion. Neck supple. No JVD present. No tracheal deviation present. No thyromegaly present.   Cardiovascular: Normal rate, regular rhythm and normal heart sounds.    Pulmonary/Chest: Effort normal and breath sounds normal. No stridor. No respiratory distress.   Abdominal: Soft. Bowel sounds are normal. She exhibits no distension. There is no tenderness. There is no rebound and no guarding.   Musculoskeletal: She exhibits edema and tenderness.         Left wrist: She exhibits decreased range of motion, tenderness and swelling. She exhibits no crepitus.   Left wrist with edema and tenderness to the dorsum and mid hand proximal to distal length to radius and ulna. No single point of tenderness. No crepitus. She has limited pronation and supination of the left wrist and poor thumb extension function. Sensation normal to all digits. Cap refill less than 2 seconds to all digits.    Lymphadenopathy:     She has no cervical adenopathy.   Neurological: She is alert and oriented to person, place, and time. She has normal reflexes. No cranial nerve deficit. Coordination normal.   Skin: Skin is warm and dry. No rash noted. She is not diaphoretic. No erythema.   Psychiatric: She has a normal mood and affect. Her behavior is normal. Judgment and thought content normal.   Nursing note and vitals reviewed.     RESULTS:    RADIOLOGY FINDINGS  Left hand X-ray shows confluence of shadows obscuring clear fracture evaluation, but suspicious for fracture of pisiform or hamate bone. Metacarpal bones normal, radius and ulna are normal.   Pending review by Radiologist  Recorded by Salina April, ED Scribe, as dictated by Placido Sou, MD     RADIOLOGY FINDINGS  Left wrist X-ray shows confluence of shadows obscuring clear fracture evaluation, but suspicious for fracture of pisiform or hamate bone. Metacarpal bones normal, radius and ulna are normal.   Pending review by Radiologist  Recorded by Salina April, ED Scribe, as dictated by Placido Sou, MD     XR HAND LT MIN 3 V   Final Result: No acute findings. Chronic/degenerative changes as described.  As read by the radiologist.       XR WRIST LT AP/LAT/OBL MIN 3V   Final Result: No acute osseus abnormality identified.  As read by the radiologist.         Labs Reviewed - No data to display    No results found for this or any previous visit (from the past 12 hour(s)).  MDM  Number of Diagnoses or Management Options   Closed nondisplaced fracture of carpal bone of left wrist, unspecified carpal bone, initial encounter:   Diagnosis management comments: DDx: Sprain vs. fracture of left hand and wrist. Should also consider ligament problem, i.e. scapholunate dissociation.  mechanism and exam suggests fx or similar injury though plain films suggest chronic disease       Amount and/or Complexity of Data Reviewed  Tests in the radiology section of CPT??: ordered and reviewed (Left wrist x-ray, Left hand x-ray)  Tests in the medicine section of CPT??: ordered and reviewed  Independent visualization of images, tracings, or specimens: yes (Left wrist x-ray, Left hand x-ray  )      ED Course     MEDICATIONS GIVEN:  Medications   HYDROcodone-acetaminophen (NORCO) 5-325 mg per tablet 1 Tab (1 Tab Oral Given 11/08/15 1006)        Procedures    PROGRESS NOTE:   9:16 AM  Initial assessment performed.   Written by Salina AprilNick Doyle, ED Scribe, as dictated by Placido SouJeffrey Treavon Castilleja, MD     CONSULT NOTE:   10:56 AM   Placido SouJeffrey Lang Zingg, MD spoke with Luane Schoolonia Yocum, PA for Eric FormJeffrey Carlson, MD  Specialty: Orthopedics  Discussed pt's hx, disposition, and available diagnostic and imaging results over the telephone. Reviewed care plans. She states they can see pt in their office today.   Written by Riki SheerNicklaus C Doyle, ED Scribe, as dictated by Placido SouJeffrey Ladan Vanderzanden, MD.     DISCHARGE NOTE:  10:58 AM   Golda AcreIngeborg B Koerber's  results have been reviewed with her.  She has been counseled regarding her diagnosis, treatment, and plan.  She verbally conveys understanding and agreement of the signs, symptoms, diagnosis, treatment and prognosis and additionally agrees to follow up as discussed.  She also agrees with the care-plan and conveys that all of her questions have been answered.  I have also provided discharge instructions for her that include: educational information regarding their diagnosis and treatment, and list of reasons why they would want to return to the ED  prior to their follow-up appointment, should her condition change.     The patient and/or family have been provided with education for proper Emergency Department utilization.     CLINICAL IMPRESSION:    1. Closed nondisplaced fracture of carpal bone of left wrist, unspecified carpal bone, initial encounter        AFTER VISIT PLAN:    Current Discharge Medication List      CONTINUE these medications which have NOT CHANGED    Details   aspirin 81 mg chewable tablet Take 81 mg by mouth daily.      ezetimibe-simvastatin (VYTORIN 10/40) 10-40 mg per tablet Take 1 Tab by mouth nightly.      peg 400-propylene glycol (SYSTANE, PROPYLENE GLYCOL,) 0.4-0.3 % drop Administer  to left eye as needed.      ondansetron (ZOFRAN ODT) 4 mg disintegrating tablet Take 1 Tab by mouth every eight (8) hours as needed for Nausea.  Qty: 12 Tab, Refills: 0      !! mirabegron (MYRBETRIQ) 50 mg Tb24 Take 50 mg by mouth daily.  Qty: 90 tablet, Refills: 3    Associated Diagnoses: OAB (overactive bladder)      !! mirabegron (MYRBETRIQ) 50 mg Tb24 Take 50 mg by mouth daily.  Qty: 90 tablet, Refills: 3      nitroglycerin (NITROSTAT) 0.4 mg SL tablet by SubLINGual route every five (5) minutes as needed for Chest  Pain.      atorvastatin (LIPITOR) 40 mg tablet Take  by mouth daily.      oxyCODONE IR (ROXICODONE) 5 mg immediate release tablet Take 5 mg by mouth every four (4) hours as needed for Pain.      esomeprazole (NEXIUM) 40 mg capsule Take  by mouth daily.      traZODone (DESYREL) 50 mg tablet Take  by mouth nightly.      zolpidem (AMBIEN) 5 mg tablet Take  by mouth nightly as needed for Sleep.      ferrous sulfate (IRON) 325 mg (65 mg iron) tablet Take  by mouth Daily (before breakfast).      tolterodine (DETROL) 2 mg tablet Take 2 mg by mouth two (2) times a day.      acetaminophen (TYLENOL EXTRA STRENGTH) 500 mg tablet Take 500 mg by mouth every six (6) hours as needed for Pain.       CALCIUM CARBONATE/VITAMIN D3 (CALCIUM 600 + D,3, PO) Take  by mouth daily.      hydroxychloroquine (PLAQUENIL) 200 mg tablet Take 200 mg by mouth daily.      magnesium 200 mg Tab Take  by mouth daily.      carvedilol (COREG CR) 20 mg CR capsule Take 20 mg by mouth daily (with breakfast).      cevimeline (EVOXAC) 30 mg capsule Take 30 mg by mouth three (3) times daily. Indications: XEROSTOMIA SECONDARY TO SJOGREN'S SYNDROME      telmisartan (MICARDIS) 20 mg tablet Take 20 mg by mouth daily. Indications: HYPERTENSION      cycloSPORINE (RESTASIS) 0.05 % ophthalmic emulsion Administer 1 Drop to both eyes two (2) times a day.       !! - Potential duplicate medications found. Please discuss with provider.           Follow-up Information     Follow up With Details Comments Contact Info    Dixon Boos, MD [facility] follow up [address]  [facility]  [place] [address]  [phone]      [facility] EMERGENCY DEPT  As needed, If symptoms worsen [address]  [place] [address]  [phone]           Attestations:  This note is prepared by Salina April, acting as Scribe for Placido Sou, MD.    Placido Sou, MD: The scribe's documentation has been prepared under my direction and personally reviewed by me in its entirety. I confirm that the note above accurately reflects all work, treatment, procedures, and medical decision making performed by me.

## 2015-11-08 NOTE — ED Notes (Signed)
Pt discharged to home, reviewed instructions with patient, patient verbalized understanding of instructions and follow up plan.  Arm band removed and shredded. All questions answered.  Patient discharged in no acute distress.

## 2015-11-08 NOTE — ED Notes (Signed)
Pt discharged to home, reviewed instructions with patient, patient verbalized understanding of instructions and follow up plan.  Arm band removed and shredded. All questions answered.  Patient discharged in no acute distress.,  Pt to go directly to her orthopedic for an appointment after leaving here

## 2015-11-08 NOTE — ED Notes (Signed)
Warm blanket placed.

## 2015-11-08 NOTE — ED Notes (Signed)
Dr Edilia Bodickson at [facility] discussing xray results and plan of care

## 2016-01-20 ENCOUNTER — Inpatient Hospital Stay
Admit: 2016-01-20 | Discharge: 2016-01-20 | Disposition: A | Discharge status: Home / Self Care | Payer: MEDICARE | Attending: Emergency Medicine

## 2016-01-20 ENCOUNTER — Emergency Department: Admit: 2016-01-20 | Payer: MEDICARE | Primary: Family Medicine

## 2016-01-20 DIAGNOSIS — E860 Dehydration: Secondary | ICD-10-CM

## 2016-01-20 LAB — URINALYSIS W/ RFLX MICROSCOPIC
Bilirubin: NEGATIVE
Glucose: 250 mg/dL — AB
Ketone: 15 mg/dL — AB
Leukocyte Esterase: NEGATIVE
Nitrites: NEGATIVE
Specific gravity: 1.009 (ref 1.005–1.030)
Urobilinogen: 0.2 EU/dL (ref 0.2–1.0)
pH (UA): 8.5 — ABNORMAL HIGH (ref 5.0–8.0)

## 2016-01-20 LAB — NT-PRO BNP: NT pro-BNP: 444 PG/ML (ref 0–1800)

## 2016-01-20 LAB — CBC WITH AUTOMATED DIFF
ABS. BASOPHILS: 0 10*3/uL (ref 0.0–0.06)
ABS. EOSINOPHILS: 0.1 10*3/uL (ref 0.0–0.4)
ABS. LYMPHOCYTES: 2 10*3/uL (ref 0.9–3.6)
ABS. MONOCYTES: 0.5 10*3/uL (ref 0.05–1.2)
ABS. NEUTROPHILS: 5.2 10*3/uL (ref 1.8–8.0)
BASOPHILS: 0 % (ref 0–2)
EOSINOPHILS: 1 % (ref 0–5)
HCT: 40.1 % (ref 35.0–45.0)
HGB: 13.5 g/dL (ref 12.0–16.0)
LYMPHOCYTES: 25 % (ref 21–52)
MCH: 30.8 PG (ref 24.0–34.0)
MCHC: 33.7 g/dL (ref 31.0–37.0)
MCV: 91.3 FL (ref 74.0–97.0)
MONOCYTES: 7 % (ref 3–10)
MPV: 10.3 FL (ref 9.2–11.8)
NEUTROPHILS: 67 % (ref 40–73)
PLATELET: 254 10*3/uL (ref 135–420)
RBC: 4.39 M/uL (ref 4.20–5.30)
RDW: 13.4 % (ref 11.6–14.5)
WBC: 7.9 10*3/uL (ref 4.6–13.2)

## 2016-01-20 LAB — LACTIC ACID: Lactic acid: 2.5 MMOL/L — CR (ref 0.4–2.0)

## 2016-01-20 LAB — URINE MICROSCOPIC ONLY
RBC: 5 /hpf (ref 0–5)
WBC: 0 /hpf (ref 0–5)

## 2016-01-20 LAB — METABOLIC PANEL, COMPREHENSIVE
A-G Ratio: 1.2 (ref 0.8–1.7)
ALT (SGPT): 32 U/L (ref 13–56)
AST (SGOT): 24 U/L (ref 15–37)
Albumin: 4.5 g/dL (ref 3.4–5.0)
Alk. phosphatase: 41 U/L — ABNORMAL LOW (ref 45–117)
Anion gap: 14 mmol/L (ref 3.0–18)
BUN/Creatinine ratio: 13 (ref 12–20)
BUN: 11 MG/DL (ref 7.0–18)
Bilirubin, total: 1.1 MG/DL — ABNORMAL HIGH (ref 0.2–1.0)
CO2: 24 mmol/L (ref 21–32)
Calcium: 9.5 MG/DL (ref 8.5–10.1)
Chloride: 98 mmol/L — ABNORMAL LOW (ref 100–108)
Creatinine: 0.88 MG/DL (ref 0.6–1.3)
GFR est AA: >60 mL/min/{1.73_m2} (ref >60)
GFR est non-AA: >60 mL/min/{1.73_m2} (ref >60)
Globulin: 3.7 g/dL (ref 2.0–4.0)
Glucose: 186 mg/dL — ABNORMAL HIGH (ref 74–99)
Potassium: 3.7 mmol/L (ref 3.5–5.5)
Protein, total: 8.2 g/dL (ref 6.4–8.2)
Sodium: 136 mmol/L (ref 136–145)

## 2016-01-20 LAB — CARDIAC PANEL,(CK, CKMB & TROPONIN)
CK - MB: 2.4 ng/ml (ref 0.5–3.6)
CK-MB Index: 2.5 % (ref 0.0–4.0)
CK: 95 U/L (ref 26–192)
Troponin-I, QT: <0.02 NG/ML (ref 0.00–0.06)

## 2016-01-20 LAB — MAGNESIUM: Magnesium: 1.7 mg/dL — ABNORMAL LOW (ref 1.8–2.4)

## 2016-01-20 LAB — LIPASE: Lipase: 235 U/L (ref 73–393)

## 2016-01-20 MED ORDER — SODIUM CHLORIDE 0.9 % IJ SYRG
Freq: Three times a day (TID) | INTRAMUSCULAR | Status: DC
Start: 2016-01-20 — End: 2016-01-20
  Administered 2016-01-20: 13:00:00 via INTRAVENOUS

## 2016-01-20 MED ORDER — SODIUM CHLORIDE 0.9% BOLUS IV
0.9 % | INTRAVENOUS | Status: AC
Start: 2016-01-20 — End: 2016-01-20
  Administered 2016-01-20: 13:00:00 via INTRAVENOUS

## 2016-01-20 MED ORDER — SODIUM CHLORIDE 0.9 % IJ SYRG
INTRAMUSCULAR | Status: DC | PRN
Start: 2016-01-20 — End: 2016-01-20

## 2016-01-20 MED ORDER — PANTOPRAZOLE 40 MG IV SOLR
40 mg | INTRAVENOUS | Status: AC
Start: 2016-01-20 — End: 2016-01-20
  Administered 2016-01-20: 13:00:00 via INTRAVENOUS

## 2016-01-20 MED ORDER — SODIUM CHLORIDE 0.9 % IV
25 mg/mL | Freq: Once | INTRAVENOUS | Status: AC
Start: 2016-01-20 — End: 2016-01-20
  Administered 2016-01-20: 13:00:00 via INTRAVENOUS

## 2016-01-20 MED ORDER — ONDANSETRON 4 MG TAB, RAPID DISSOLVE
4 mg | ORAL_TABLET | Freq: Three times a day (TID) | ORAL | 0 refills | Status: DC | PRN
Start: 2016-01-20 — End: 2016-07-28

## 2016-01-20 MED FILL — PROTONIX 40 MG INTRAVENOUS SOLUTION: 40 mg | INTRAVENOUS | Qty: 40

## 2016-01-20 MED FILL — BD POSIFLUSH NORMAL SALINE 0.9 % INJECTION SYRINGE: INTRAMUSCULAR | Qty: 10

## 2016-01-20 MED FILL — PROMETHAZINE 25 MG/ML INJECTION: 25 mg/mL | INTRAMUSCULAR | Qty: 1

## 2016-01-20 MED FILL — SODIUM CHLORIDE 0.9 % IV: INTRAVENOUS | Qty: 1000

## 2016-01-20 NOTE — ED Notes (Signed)
Still awaiting patient's daughter to arrive for transportation. Patient is stable. Denies pain. No nausea, vomiting or diarrhea since arriving.

## 2016-01-20 NOTE — ED Notes (Signed)
Spoke with daughter April Orzel who resides out of state to see if local transportation can be arranged back to [place]. Daughter reports that she will contact another sibling (sister) to see if they are able to transport patient home. Awaiting return phone call.

## 2016-01-20 NOTE — ED Provider Notes (Signed)
HPI Comments:   7:00 AM   Marissa Harrell is a 82 y.o. female presenting to the ED via EMS C/O abd pain onset last evening. Associated sx include N/V/D. EMS could not provide a hx as the pt was not very forthcoming. In talking to the pt, she notes feeling well yesterday and as of this morning had N/V/D. There was no blood in either source. She has diffuse abd discomfort and in general does not feel well.  She notes having a hx of heart problems, but are not very clear as to what they are. Upon review PMHx shows she has had stents placed in the past. Pt is a former smoker and uses EtOH once per week. Pt denies CP, fever, chills, hematemesis, hematochezia, SOB, dizziness, and any other associated signs and sx.      Patient is a 82 y.o. female presenting with abdominal pain. The history is provided by the patient. No language interpreter was used.   Abdominal Pain    This is a new problem. The current episode started yesterday. The problem occurs constantly. Associated symptoms include diarrhea, nausea and vomiting. Pertinent negatives include no fever, no hematuria and no chest pain.        Past Medical History:   Diagnosis Date   ??? Arthritis    ??? CAD (coronary artery disease)      s/p stents, EF 33%   ??? Chronic kidney disease      renal artery stenosis   ??? Esophageal reflux 11/05/2012   ??? GERD (gastroesophageal reflux disease)      well controlled   ??? Heart attack ([place])    ??? Hypertension    ??? Nausea & vomiting    ??? OAB (overactive bladder) 08/10/2013     82 y.o. female who presents today for s/p bladder in the [employer] > 10 years ago Had problems with catheter . Now if under stress c/o OAB Tried Toviaz without any help Now on detrol LA Has some mild SUI , mostly OAB Had urge incontinence once . Marland Kitchen Has Sjogren's syndrome [710.2] and c/o dry mouth. Will try myrbetriq 25/50 mg Nocturia 3-4     ??? Psychiatric disorder      anxiety   ??? Sjogren's syndrome [facility])        Past Surgical History:   Procedure Laterality Date    ??? Pr cardiac surg procedure unlist       stent x 3   ??? Hx appendectomy     ??? Hx urological       bladder suspension   ??? Hx cataract removal       bilateral   ??? Hx cervical fusion     ??? Hx orthopaedic       Elbow         History reviewed. No pertinent family history.    Social History     Social History   ??? Marital status: MARRIED     Spouse name: N/A   ??? Number of children: N/A   ??? Years of education: N/A     Occupational History   ??? Not on file.     Social History Main Topics   ??? Smoking status: Former Smoker   ??? Smokeless tobacco: Never Used   ??? Alcohol use Yes      Comment: 1 per week   ??? Drug use: No   ??? Sexual activity: Not Currently     Partners: Male     Other Topics Concern   ???  Not on file     Social History Narrative         ALLERGIES: Codeine    Review of Systems   Constitutional: Negative for chills and fever.   Respiratory: Negative for shortness of breath.    Cardiovascular: Negative for chest pain.   Gastrointestinal: Positive for abdominal pain, diarrhea, nausea and vomiting. Negative for blood in stool.   Genitourinary: Negative for hematuria.   Neurological: Negative for dizziness.   All other systems reviewed and are negative.      Vitals:    01/20/16 0714 01/20/16 0743 01/20/16 0841   BP: 190/89 (!) 183/93 159/74   Pulse: 83  78   Resp: 23  12   Temp: 97.6 ??F (36.4 ??C)     SpO2: 99%  95%   Weight: 49.9 kg (110 lb)     Height: 5' 4"  (1.626 m)              Physical Exam   Nursing note and vitals reviewed.   -------------------------PHYSICAL EXAM-------------------------    Vital signs and nursing notes reviewed  Nursing note and VS were reviewed      CONSTITUTIONAL: Appears uncomfortable. Chronically ill. Alert and oriented. Mucous membranes are tacky. Awake and alert. Non-toxic in appearance, not diaphoretic. Afebrile.     HEAD: Normocephalic, Atraumatic.    EYES: Pupils are equal, round and reactive. Extra-ocular movements intact. Sclera anicteric. Conjunctiva not injected.      ENT: Mucous membranes are moist. There is no erythema or swelling of the mucosal tissues or enlargement of the tonsillar tissue or the presence of exudates. No oral lesions or thrush. The left TM is unremarkable. Both EAC's are without swelling or erythema. Both TM's unremarkable. Nasal mucosa pink with no discharge and the turbinates are of normal size.     NECK: Normal ROM. Neck is supple. No posterior cervical paraspinal or midline tenderness. No obvious enlargement of the thyroid. No significant anterior cervical adenopathy. Trachea is midline.    CARDIOVASCULAR:  regular rhythm. No murmurs, rubs or gallops. Distal pulses are 2+ and equal.    PULMONARY: Respiratory effort is normal and without the use of accessory muscles. Patient is speaking in full sentences. Clear to auscultation bilaterally. No wheezing, rales or rhonchi.      CHEST WALL: Normal shape;  non tender to palpation; no crepitus    ABDOMINAL: Soft and non-distended. Diffusely tender with no obvious masses. NO peritoneal signs - No rebound, guarding or rigidity. Active bowel sounds present.    BACK: No thoracolumbar midline or paraspinal tenderness. Full range of motion. No CVA tenderness.     MUSCULOSKELETAL: No obvious soft tissue tenderness or sites of bony tenderness or deformities; Full range of motion in all extremities. No obvious muscle tenderness.  No joint inflammation. No peripheral edema.    SKIN: Skin is warm and dry. Good skin turgor.  No diaphoresis, lesions,  Rashes, or petechiae. Cap Refill is Normal. Well Profused.    NEUROLOGICAL: Alert, awake and appropriately oriented. Normal speech. CN's are normal;  Motor - no focal weakness; no obvious sensory loss; Cerebellar function- intact; DTR's - 2+ equal    PSYCH: Appropriate affect; normal thought content; no expressed suicidal ideation.    MDM  Number of Diagnoses or Management Options  Diagnosis management comments: INITIAL CLINICAL IMPRESSION and PLANS:   The patient presents with the primary complaint(s) of: abdominal pain with vomiting and diarrhea. The presentation, to include historical aspects and clinical findings appear to be  consistent with the DX of acute gastroenteritis. However, other possible DX's to consider as primary, associated with, or exacerbated by include:    ACS, PNA, UTI, pancreatitis, colitis, cholecystitis    Considering the above, my initial management plan to evaluate and therapeutic interventions include: Obtain Lab Studies,, Obtain Radiologic studies,, Initiate Medications and or IV Fluids, and Obtain additional historical data from other sources  As well as those noted in the orders:    Ferd Hibbs, MD      ED Course     Medications   sodium chloride (NS) flush 5-10 mL (10 mL IntraVENous Given 01/20/16 0730)   sodium chloride (NS) flush 5-10 mL (not administered)   sodium chloride 0.9 % bolus infusion 1,000 mL (0 mL IntraVENous IV Completed 01/20/16 0837)   promethazine (PHENERGAN) 25 mg in 0.9% sodium chloride 50 mL IVPB (0 mg IntraVENous IV Completed 01/20/16 0754)   pantoprazole (PROTONIX) injection 40 mg (40 mg IntraVENous Given 01/20/16 0739)        Procedures    EKG interpretation: (Subsequent)  6:59 AM   Sinus rhythm at 91 bpm. Leftward axis deviation. LBBB.  EKG read by EMS Medic [place] and Edwena Bunde, MD    EKG interpretation: (Preliminary)  7:11 AM   NSR at 86 bpm. Left axis deviation. LBBB. CT 10/29/12: Essentially unchanged.  EKG read by Edwena Bunde, MD    PROGRESS NOTE:   7:22 AM  Reviewing the records from June last year, she had a similar presentation of N/V consistent with gastroenteritis.  Written by Anna Genre, ED Scribe, as dictated by Edwena Bunde, MD.     PROGRESS NOTE:  8:21 AM  I have evaluated the patient.   The patient notes feeling better.    Clinically:  The patient appears to have demonstrated some overall improvement in response to intervention(s).      Ancillary test results: "were reviewed.     Impression:  Acute enteritis with dehydration    Action:   ongoing aggressive interventions dictated by the patient's vital signs and /or results of diagnostic studies are required before specific disposition can be determined.     Edwena Bunde, MD    PROGRESS NOTE:   9:59 AM  Pt has been re-examined by Edwena Bunde, MD. Feeling much better. Urinating without difficulty.  Written by Anna Genre, ED Scribe, as dictated by Edwena Bunde, MD.     ED CLINICAL SUMMARY - DISCHARGE     11:17 AM  CLINICAL COURSE while in the ED:      Intervention)s) while in ED:  ACTIONS / APPROACH: Based on the presenting ACUTE history of abdominal pain, nausea, vomiting, and diarrhea. My initial focus was to Determine the cause and extent of the problem, Initiate Treatment as Appropriate and STABILIZE THE PATIENT'S CONDITION .  Details of actions taken are noted below.     SPECIFICS REGARDING APPROACH:     1. DIAGNOSTIC RESULTS:   XR ABD (KUB)   Final Result   Impression:  Nonobstructive bowel gas pattern. No evidence of acute abnormality.    XR CHEST PORT   Final Result   Impression:  No radiographic evidence of an acute abnormality.     As read by the radiologist.         Labs Reviewed   METABOLIC PANEL, COMPREHENSIVE - Abnormal; Notable for the following:        Result Value    Chloride 98 (*)  Glucose 186 (*)     Bilirubin, total 1.1 (*)     Alk. phosphatase 41 (*)     All other components within normal limits   MAGNESIUM - Abnormal; Notable for the following:     Magnesium 1.7 (*)     All other components within normal limits   URINALYSIS W/ RFLX MICROSCOPIC - Abnormal; Notable for the following:     pH (UA) 8.5 (*)     Protein TRACE (*)     Glucose 250 (*)     Ketone 15 (*)     Blood TRACE (*)     All other components within normal limits   LACTIC ACID, PLASMA - Abnormal; Notable for the following:     Lactic acid 2.5 (*)      All other components within normal limits   URINE MICROSCOPIC ONLY - Abnormal; Notable for the following:     Bacteria FEW (*)     All other components within normal limits   CBC WITH AUTOMATED DIFF   PRO-BNP   CARDIAC PANEL,(CK, CKMB & TROPONIN)   LIPASE           Recent Results (from the past 12 hour(s))   CBC WITH AUTOMATED DIFF    Collection Time: 01/20/16  7:15 AM   Result Value Ref Range    WBC 7.9 4.6 - 13.2 K/uL    RBC 4.39 4.20 - 5.30 M/uL    HGB 13.5 12.0 - 16.0 g/dL    HCT 40.1 35.0 - 45.0 %    MCV 91.3 74.0 - 97.0 FL    MCH 30.8 24.0 - 34.0 PG    MCHC 33.7 31.0 - 37.0 g/dL    RDW 13.4 11.6 - 14.5 %    PLATELET 254 135 - 420 K/uL    MPV 10.3 9.2 - 11.8 FL    NEUTROPHILS 67 40 - 73 %    LYMPHOCYTES 25 21 - 52 %    MONOCYTES 7 3 - 10 %    EOSINOPHILS 1 0 - 5 %    BASOPHILS 0 0 - 2 %    ABS. NEUTROPHILS 5.2 1.8 - 8.0 K/UL    ABS. LYMPHOCYTES 2.0 0.9 - 3.6 K/UL    ABS. MONOCYTES 0.5 0.05 - 1.2 K/UL    ABS. EOSINOPHILS 0.1 0.0 - 0.4 K/UL    ABS. BASOPHILS 0.0 0.0 - 0.06 K/UL    DF AUTOMATED     METABOLIC PANEL, COMPREHENSIVE    Collection Time: 01/20/16  7:15 AM   Result Value Ref Range    Sodium 136 136 - 145 mmol/L    Potassium 3.7 3.5 - 5.5 mmol/L    Chloride 98 (L) 100 - 108 mmol/L    CO2 24 21 - 32 mmol/L    Anion gap 14 3.0 - 18 mmol/L    Glucose 186 (H) 74 - 99 mg/dL    BUN 11 7.0 - 18 MG/DL    Creatinine 0.88 0.6 - 1.3 MG/DL    BUN/Creatinine ratio 13 12 - 20      GFR est AA >60 >60 ml/min/1.69m    GFR est non-AA >60 >60 ml/min/1.749m   Calcium 9.5 8.5 - 10.1 MG/DL    Bilirubin, total 1.1 (H) 0.2 - 1.0 MG/DL    ALT (SGPT) 32 13 - 56 U/L    AST (SGOT) 24 15 - 37 U/L    Alk. phosphatase 41 (L) 45 - 117 U/L    Protein, total  8.2 6.4 - 8.2 g/dL    Albumin 4.5 3.4 - 5.0 g/dL    Globulin 3.7 2.0 - 4.0 g/dL    A-G Ratio 1.2 0.8 - 1.7     MAGNESIUM    Collection Time: 01/20/16  7:15 AM   Result Value Ref Range    Magnesium 1.7 (L) 1.8 - 2.4 mg/dL   PRO-BNP    Collection Time: 01/20/16  7:15 AM    Result Value Ref Range    NT pro-BNP 444 0 - 1800 PG/ML   CARDIAC PANEL,(CK, CKMB & TROPONIN)    Collection Time: 01/20/16  7:15 AM   Result Value Ref Range    CK 95 26 - 192 U/L    CK - MB 2.4 0.5 - 3.6 ng/ml    CK-MB Index 2.5 0.0 - 4.0 %    Troponin-I, Qt. <0.02 0.00 - 0.06 NG/ML   LIPASE    Collection Time: 01/20/16  7:15 AM   Result Value Ref Range    Lipase 235 73 - 393 U/L   URINALYSIS W/ RFLX MICROSCOPIC    Collection Time: 01/20/16  7:34 AM   Result Value Ref Range    Color YELLOW      Appearance CLEAR      Specific gravity 1.009 1.005 - 1.030      pH (UA) 8.5 (H) 5.0 - 8.0      Protein TRACE (A) NEG mg/dL    Glucose 250 (A) NEG mg/dL    Ketone 15 (A) NEG mg/dL    Bilirubin NEGATIVE  NEG      Blood TRACE (A) NEG      Urobilinogen 0.2 0.2 - 1.0 EU/dL    Nitrites NEGATIVE  NEG      Leukocyte Esterase NEGATIVE  NEG     URINE MICROSCOPIC ONLY    Collection Time: 01/20/16  7:34 AM   Result Value Ref Range    WBC 0 to 2 0 - 5 /hpf    RBC 5 to 10 0 - 5 /hpf    Epithelial cells 1+ 0 - 5 /lpf    Bacteria FEW (A) NEG /hpf   LACTIC ACID, PLASMA    Collection Time: 01/20/16  7:40 AM   Result Value Ref Range    Lactic acid 2.5 (HH) 0.4 - 2.0 MMOL/L       2. MEDICATIONS GIVEN:   Medications   sodium chloride (NS) flush 5-10 mL (10 mL IntraVENous Given 01/20/16 0730)   sodium chloride (NS) flush 5-10 mL (not administered)   sodium chloride 0.9 % bolus infusion 1,000 mL (0 mL IntraVENous IV Completed 01/20/16 0837)   promethazine (PHENERGAN) 25 mg in 0.9% sodium chloride 50 mL IVPB (0 mg IntraVENous IV Completed 01/20/16 0754)   pantoprazole (PROTONIX) injection 40 mg (40 mg IntraVENous Given 01/20/16 0739)        Response to Intervention(s):   IMPROVED       Unanticipated Developments: NONE     ED COURSE - General Comment:  During the ED course I had re-evaluated the patient, answered their and /or their family's questions regarding my clinical impression, the  patient's condition and plans for therapeutic interventions. The patient's ED course was uneventful and remained stable throughout.    CLINICAL IMPRESSION AND DISCUSSION:   I reviewed our electronic medical record system for any past medical records that were available that may contribute to the patients current condition, the nursing notes and vital signs from today's visit. Based on the  clinical presentation, findings and results of diagnostic studies, as well as developments while in the ED,  I suspect the following:        For the presentation noted above    The most likely cause or diagnosis is: Acute gastritis without hemorrhage and Dehydration    DISCUSSION REGARDING DIAGNOSIS: The specifics regarding my clinical impression / diagnosis are as follows:     At this time there is no clinical evidence to support other pertinent diagnostic considerations such as:   N/A    Finally, other diagnostic considerations during this visit are noted below in Clinical Impression.       Specific Conversations:  NONE      DISPOSITION DECISION:     DISCHARGE: I feel that we have optimized outpatient assessment and management such that Marissa Harrell is stable to be discharged and to continue with her care or complete any additional evaluation as appropriate at home or as an outpatient. Preparations will be made to discharge the patient.    Present condition at the time of disposition: STABLE    DISCUSSION REGARDING THE DISPOSITION:         DISCHARGE NOTE:    Marissa Harrell's  results have been reviewed with her.  She has been counseled regarding her diagnosis, treatment, and plan.  She verbally conveys understanding and agreement of the signs, symptoms, diagnosis, treatment and prognosis and additionally agrees to follow up as discussed.  She also agrees with the care-plan and conveys that all of her questions have been answered.  I have also provided discharge instructions for her  that include: educational information regarding their diagnosis and treatment, and list of reasons why they would want to return to the ED prior to their follow-up appointment, should her condition change. The patient and/or family has been provided with education for proper Emergency Department utilization.      CLINICAL IMPRESSION  1. Other acute gastritis without hemorrhage    2. Dehydration        PLAN:  1. D/C home  2.   Patient's Medications   Start Taking    No medications on file   Continue Taking    ACETAMINOPHEN (TYLENOL EXTRA STRENGTH) 500 MG TABLET    Take 500 mg by mouth every six (6) hours as needed for Pain.    ASPIRIN 81 MG CHEWABLE TABLET    Take 81 mg by mouth daily.    ATORVASTATIN (LIPITOR) 40 MG TABLET    Take  by mouth daily.    CALCIUM CARBONATE/VITAMIN D3 (CALCIUM 600 + D,3, PO)    Take  by mouth daily.    CARVEDILOL (COREG CR) 20 MG CR CAPSULE    Take 20 mg by mouth daily (with breakfast).    CEVIMELINE (EVOXAC) 30 MG CAPSULE    Take 30 mg by mouth three (3) times daily. Indications: XEROSTOMIA SECONDARY TO SJOGREN'S SYNDROME    CYCLOSPORINE (RESTASIS) 0.05 % OPHTHALMIC EMULSION    Administer 1 Drop to both eyes two (2) times a day.    ESOMEPRAZOLE (NEXIUM) 40 MG CAPSULE    Take  by mouth daily.    EZETIMIBE-SIMVASTATIN (VYTORIN 10/40) 10-40 MG PER TABLET    Take 1 Tab by mouth nightly.    FERROUS SULFATE (IRON) 325 MG (65 MG IRON) TABLET    Take  by mouth Daily (before breakfast).    HYDROXYCHLOROQUINE (PLAQUENIL) 200 MG TABLET    Take 200 mg by mouth daily.    MAGNESIUM 200  MG TAB    Take  by mouth daily.    MIRABEGRON (MYRBETRIQ) 50 MG TB24    Take 50 mg by mouth daily.    MIRABEGRON (MYRBETRIQ) 50 MG TB24    Take 50 mg by mouth daily.    NITROGLYCERIN (NITROSTAT) 0.4 MG SL TABLET    by SubLINGual route every five (5) minutes as needed for Chest Pain.    ONDANSETRON (ZOFRAN ODT) 4 MG DISINTEGRATING TABLET    Take 1 Tab by mouth every eight (8) hours as needed for Nausea.     OXYCODONE IR (ROXICODONE) 5 MG IMMEDIATE RELEASE TABLET    Take 5 mg by mouth every four (4) hours as needed for Pain.    PEG 400-PROPYLENE GLYCOL (SYSTANE, PROPYLENE GLYCOL,) 0.4-0.3 % DROP    Administer  to left eye as needed.    TELMISARTAN (MICARDIS) 20 MG TABLET    Take 20 mg by mouth daily. Indications: HYPERTENSION    TOLTERODINE (DETROL) 2 MG TABLET    Take 2 mg by mouth two (2) times a day.    TRAZODONE (DESYREL) 50 MG TABLET    Take  by mouth nightly.    ZOLPIDEM (AMBIEN) 5 MG TABLET    Take  by mouth nightly as needed for Sleep.   These Medications have changed    No medications on file   Stop Taking    No medications on file     3.   Follow-up Information     Follow up With Details Comments [place], [place] [address]  [phone]      [facility] EMERGENCY DEPT   [place] [id number]  [phone]        Return if sxs worsen    ATTESTATIONS:  This note is prepared by Anna Genre, acting as Scribe for Edwena Bunde, MD.    Edwena Bunde, MD: The scribe's documentation has been prepared under my direction and personally reviewed by me in its entirety. I confirm that the note above accurately reflects all work, treatment, procedures, and medical decision making performed by me.

## 2016-01-20 NOTE — ED Triage Notes (Addendum)
Patient arrived to ER [facility] for abdominal pain, nausea, vomiting and diarrhea. Patient is a resident at [facility]. Patient started with abdominal pain last evening and had episodes of vomiting. Zofran 4 mg administered while in route to hospital from EMS. 12 EKG lead per EMS shows SR, L. BBB. On arrival to ER, EKG done and also showed NSR, L. BBB.    Sepsis Screening completed    (  )Patient meets SIRS criteria.  (X  )Patient does not meet SIRS criteria.      SIRS Criteria is achieved when two or more of the following are present  ? Temperature < 96.8??F (36??C) or > 100.9??F (38.3??C)  ? Heart Rate > 90 beats per minute  ? Respiratory Rate > 20 beats per minute  ? WBC count > 12,000 or <4,000 or > 10% bands

## 2016-01-20 NOTE — ED Notes (Signed)
Daughter arrived to transport patient back to [facility]. Discharge reviewed with daughter/patient.

## 2016-01-20 NOTE — ED Notes (Signed)
Return call rec'd from daughter April Dominic. Grand-daughter Florentina Addison will pick patient up and transport her back to [facility]. All contact information for this nurse was given to grand-daughter by April Burgner.

## 2016-01-20 NOTE — ED Notes (Signed)
Pt placed on bedpan.

## 2016-01-20 NOTE — ED Notes (Signed)
Call rec'd from Katie-stating that her mother Marissa Harrell will pick patient up and transport her back to [facility].

## 2016-01-20 NOTE — ED Notes (Signed)
I have reviewed discharge instructions with the patient.  The patient verbalized understanding Patient armband removed and shredded

## 2016-01-24 LAB — EKG, 12 LEAD, INITIAL
Atrial Rate: 86 {beats}/min
Calculated P Axis: 73 degrees
Calculated R Axis: -47 degrees
Calculated T Axis: 98 degrees
Diagnosis: NORMAL
P-R Interval: 176 ms
Q-T Interval: 448 ms
QRS Duration: 152 ms
QTC Calculation (Bezet): 536 ms
Ventricular Rate: 86 {beats}/min

## 2016-04-03 ENCOUNTER — Inpatient Hospital Stay
Admit: 2016-04-03 | Discharge: 2016-04-03 | Disposition: A | Discharge status: Home / Self Care | Payer: MEDICARE | Attending: Emergency Medicine

## 2016-04-03 ENCOUNTER — Emergency Department: Admit: 2016-04-03 | Payer: MEDICARE | Primary: Family Medicine

## 2016-04-03 DIAGNOSIS — H8113 Benign paroxysmal vertigo, bilateral: Secondary | ICD-10-CM

## 2016-04-03 MED ORDER — MECLIZINE 25 MG TAB
25 mg | ORAL_TABLET | Freq: Three times a day (TID) | ORAL | 0 refills | Status: DC | PRN
Start: 2016-04-03 — End: 2017-04-07

## 2016-04-03 NOTE — ED Provider Notes (Signed)
HPI Comments: 12:00 PM    Marissa Harrell is a 82 y.o. female presenting to the ED C/O dizziness onset this AM. Associated Sx include HA, cough, and congestion. Pt reports she has never had a HA this severe before. Pain rated 6/10. Pt states the dizziness is exacerbated by standing up. Pt denies trouble hearing, tinnitus, problems with moving arms or legs, SOB, and any other symptoms or complaints.    Written by Nadine Counts, ED Scribe, as dictated by Tami Ribas, MD         Patient is a 82 y.o. female presenting with dizziness. The history is provided by the patient. No language interpreter was used.   Dizziness   This is a new problem. The current episode started more than 2 days ago. The problem has not changed since onset.There has been no fever. Associated symptoms include headaches. Pertinent negatives include no shortness of breath, no chest pain, no vomiting and no nausea.        Past Medical History:   Diagnosis Date   ??? Arthritis    ??? CAD (coronary artery disease)     s/p stents, EF 33%   ??? Chronic kidney disease     renal artery stenosis   ??? Esophageal reflux 11/05/2012   ??? GERD (gastroesophageal reflux disease)     well controlled   ??? Heart attack (HCC)    ??? Hypertension    ??? Nausea & vomiting    ??? OAB (overactive bladder) 08/10/2013    82 y.o. female who presents today for s/p bladder in the [employer] > 10 years ago Had problems with catheter . Now if under stress c/o OAB Tried Toviaz without any help Now on detrol LA Has some mild SUI , mostly OAB Had urge incontinence once . Marland Kitchen Has Sjogren's syndrome [710.2] and c/o dry mouth. Will try myrbetriq 25/50 mg Nocturia 3-4     ??? Psychiatric disorder     anxiety   ??? Sjogren's syndrome (HCC)        Past Surgical History:   Procedure Laterality Date   ??? CARDIAC SURG PROCEDURE UNLIST      stent x 3   ??? HX APPENDECTOMY     ??? HX BACK SURGERY     ??? HX CATARACT REMOVAL      bilateral   ??? HX CERVICAL FUSION     ??? HX ORTHOPAEDIC      Elbow   ??? HX UROLOGICAL       bladder suspension         No family history on file.    Social History     Social History   ??? Marital status: MARRIED     Spouse name: N/A   ??? Number of children: N/A   ??? Years of education: N/A     Occupational History   ??? Not on file.     Social History Main Topics   ??? Smoking status: Former Smoker   ??? Smokeless tobacco: Never Used   ??? Alcohol use Yes      Comment: 1 per week   ??? Drug use: No   ??? Sexual activity: Not Currently     Partners: Male     Other Topics Concern   ??? Not on file     Social History Narrative         ALLERGIES: Codeine    Review of Systems   Constitutional: Negative for appetite change, chills, fever and unexpected weight change.  HENT: Positive for congestion. Negative for hearing loss, rhinorrhea, sore throat, tinnitus and trouble swallowing.    Eyes: Negative for pain, discharge and visual disturbance.   Respiratory: Positive for cough. Negative for chest tightness, shortness of breath and wheezing.    Cardiovascular: Negative for chest pain and leg swelling.   Gastrointestinal: Negative for abdominal pain, blood in stool, diarrhea, nausea and vomiting.   Endocrine: Negative for polydipsia and polyuria.   Genitourinary: Negative for difficulty urinating, dysuria, frequency, hematuria, menstrual problem, pelvic pain, urgency, vaginal bleeding and vaginal discharge.   Musculoskeletal: Negative for arthralgias, back pain, myalgias and neck stiffness.        Denies trouble with moving arms or legs   Skin: Negative for color change and rash.   Allergic/Immunologic: Negative for immunocompromised state.   Neurological: Positive for dizziness and headaches. Negative for syncope, weakness and numbness.   Hematological: Does not bruise/bleed easily.   Psychiatric/Behavioral: Negative for decreased concentration.   All other systems reviewed and are negative.      Vitals:    04/03/16 1146   BP: 124/74   Pulse: 70   Resp: 14   Temp: 97.6 ??F (36.4 ??C)   SpO2: 98%   Weight: 43.5 kg (96 lb)    Height: 5\' 1"  (1.549 m)            Physical Exam   Nursing note and vitals reviewed.     -------------------------PHYSICAL EXAM-------------------------    Vital signs and nursing notes reviewed  Nursing note and VS were reviewed      CONSTITUTIONAL: Mental status: Awake and alert. No immediate acute distress. Non-toxic in appearance.  Well developed, well nourished and appears adequately hydrated.      HEAD: Normocephalic, Atraumatic. Mild tenderness in the maxilla.     EYES: Pupils are equal, round and reactive. Extra-ocular movements intact. Sclera are non icteric. Conjunctiva not injected.     ENT: Mucous membranes are moist.   Oral mucosa: There is no erythema or swelling; No oral lesions or thrush.  Tonsillar tissue: Mild erythema with no exudates.      Ears: R:  EAC - clear, no swelling with TM that is normal in appearance.            L:  EAC - clear, no swelling with TM that is normal in appearance.  Nose: Mild erythema and swelling.     NECK: Normal ROM. Neck is supple.   Anterior aspect: Anterior cervical adenopathy: nothing abnormal.  No obvious enlargement of the thyroid. Trachea is midline.  Posterior aspect: Posterior cervical paraspinal muscles are non tender and  bony midline is non tender. Posterior adenopathy: none palpable.    CARDIOVASCULAR:  The rhythm is regular and the rate sounds to be normal. No murmurs, rubs or gallops. Distal pulses are 2+ and equal.    PULMONARY: Respiratory effort is normal and without the use of accessory muscles. Patient is speaking in full sentences. Air exchange is good. Breath sounds:  Clear to auscultation bilaterally. No wheezing, rales or rhonchi.      CHEST WALL: Normal shape;  non tender to palpation; no crepitus    ABDOMINAL: Visual: non -distended;   Ausculation: Bowel sounds are present.  Palpation: Soft;  No obvious organomegaly; Palpable tenderness: NONE. NO peritoneal signs - No rebound, guarding or rigidity.      BACK: Midline - No bony tenderness; Paraspinal muscles are non tender. Full range of motion. No CVA tenderness.     MUSCULOSKELETAL:  Lower Extremities: Peripheral edema- none noted; In general there is No obvious soft tissue tenderness or sites of bony tenderness or deformities;   Joints: No evidence of acute inflammatory changes and have good range of motion in all extremities.   Muscles: Good tone and No obvious muscle tenderness.      Upper Extremities: Peripheral edema- none noted; In general there is No obvious soft tissue tenderness or sites of bony tenderness or deformities;   Joints: No evidence of acute inflammatory changes and have good range of motion in all extremities.   Muscles: Good tone and No obvious muscle tenderness.    SKIN:  Good skin turgor. Cap Refill is Normal. Skin is warm and dry and with NO diaphoresis. Rashes: NONE or nothing that appears infectious; NO petechiae. NO particular lesions.      NEUROLOGICAL: Alert, awake and appropriately oriented. Normal speech. CN's are normal;  Motor - no focal weakness; 5/5 and symmetrical, no obvious sensory loss; Cerebellar function- intact; DTR's - 2+ equal. No nystagmus at rest. N-B maneuver illicits sensation of vertigo bilaterally.     PSYCH: Appropriate affect; normal thought content; no expressed suicidal ideation.     MDM  Number of Diagnoses or Management Options  Benign positional vertigo, bilateral:   Sinus congestion:   Diagnosis management comments: INITIAL CLINICAL IMPRESSION and PLANS:  The patient presents with the primary complaint(s) of: Dizziness. The presentation, to include historical aspects and clinical findings appear to be consistent with the DX of positional vertigo. However, other possible DX's to consider as primary, associated with, or exacerbated by include:    1.  URI  2.  Sinus congestion   3.  Sinus infection  4. CVA    Considering the above, my initial management plan to evaluate and  therapeutic interventions include: Obtain Radiologic studies,  As well as those noted in the orders:    Pennelope Bracken, MD        Amount and/or Complexity of Data Reviewed  Tests in the radiology section of CPT??: ordered and reviewed (CT head)  Independent visualization of images, tracings, or specimens: yes (CT head)      ED Course       Procedures      PULSE OXIMETRY NOTE:  11:59 AM   Pulse-ox is 98% on room air  Interpretation: Normal  Intervention: None   Written by Nadine Counts, ED Scribe, as dictated by Tami Ribas, MD      ED CLINICAL SUMMARY - DISCHARGE   1:01 PM  CLINICAL COURSE while in the ED:      Intervention)s) while in ED:  ACTIONS / APPROACH: Based on the presenting SUBACUTE history of dizziness. My initial focus was to Determine the cause and extent of the problem and Initiate Treatment as Appropriate .  Details of actions taken are noted below.     SPECIFICS REGARDING APPROACH:     1. DIAGNOSTIC RESULTS:   CT HEAD WO CONT   Final Result:  1. No acute intracranial abnormality. Of note, noncontrast head CT can be normal  in the context of early acute stroke.  2. Mild subcortical and periventricular white matter hypoattenuation, a  nonspecific finding likely pertaining to chronic ischemic microvascular change.  3. Minimal mucosal thickening of the anterior and posterior ethmoid air cells,  without evidence of air-fluid level.  As read by the radiologist.              Labs Reviewed - No data to display  No results found for this or any previous visit (from the past 12 hour(s)).    2. MEDICATIONS GIVEN: Medications - No data to display     Response to Intervention(s):   IMPROVED       Unanticipated Developments: NONE     ED COURSE - General Comment:  During the ED course I had re-evaluated the patient, answered their and /or their family's questions regarding my clinical impression, the patient's condition and plans for therapeutic interventions. The patient's  ED course was uneventful and remained stable throughout.    CLINICAL IMPRESSION AND DISCUSSION:   I reviewed our electronic medical record system for any past medical records that were available that may contribute to the patients current condition, the nursing notes and vital signs from today's visit. Based on the clinical presentation, findings and results of diagnostic studies, as well as developments while in the ED,  I suspect the following:        For the presentation noted above    My clinical impression is: Benign positional vertigo, bilateral. Sinus congestion.     DISCUSSION REGARDING CLINICAL IMPRESSION: The specifics regarding my clinical impression / diagnosis are as follows:     At this time there is no clinical evidence to support other pertinent diagnostic considerations such as:   N/A    SUMMARY DISCUSSION:        Specific Conversations:  NONE      DISPOSITION DECISION:     DISCHARGE: I feel that we have optimized outpatient assessment and management such that Marissa Harrell is stable to be discharged and to continue with her care or complete any additional evaluation as appropriate at home or as an outpatient. Preparations will be made to discharge the patient.    Present condition at the time of disposition: STABLE    ANY SPECIFIC INFORMATION REGARDING THE DISPOSITION: NONE        DISCHARGE NOTE:    Marissa Harrell  results have been reviewed with her.  She has been counseled regarding her diagnosis, treatment, and plan.  She verbally conveys understanding and agreement of the signs, symptoms, diagnosis, treatment and prognosis and additionally agrees to follow up as discussed.  She also agrees with the care-plan and conveys that all of her questions have been answered.  I have also provided discharge instructions for her that include: educational information regarding their diagnosis and treatment, and list of reasons why they would want to return to the ED  prior to their follow-up appointment, should her condition change. The patient and/or family has been provided with education for proper Emergency Department utilization.      CLINICAL IMPRESSION  1. Benign positional vertigo, bilateral    2. Sinus congestion        PLAN:  1. D/C home  2.   Patient's Medications   Start Taking    MECLIZINE (ANTIVERT) 25 MG TABLET    Take 1 Tab by mouth three (3) times daily as needed for Dizziness.   Continue Taking    ACETAMINOPHEN (TYLENOL EXTRA STRENGTH) 500 MG TABLET    Take 500 mg by mouth every six (6) hours as needed for Pain.    ASPIRIN 81 MG CHEWABLE TABLET    Take 81 mg by mouth daily.    ATORVASTATIN (LIPITOR) 40 MG TABLET    Take  by mouth daily.    BISACODYL (DULCOLAX, BISACODYL,) 10 MG SUPPOSITORY    Insert 10 mg into rectum daily.    CALCIUM  CARBONATE/VITAMIN D3 (CALCIUM 600 + D,3, PO)    Take  by mouth daily.    CARVEDILOL (COREG CR) 20 MG CR CAPSULE    Take 20 mg by mouth daily (with breakfast).    CYCLOSPORINE (RESTASIS) 0.05 % OPHTHALMIC EMULSION    Administer 1 Drop to both eyes two (2) times a day.    ESOMEPRAZOLE (NEXIUM) 40 MG CAPSULE    Take  by mouth daily.    EZETIMIBE-SIMVASTATIN (VYTORIN 10/40) 10-40 MG PER TABLET    Take 1 Tab by mouth nightly.    MIRABEGRON (MYRBETRIQ PO)    Take  by mouth.    NITROGLYCERIN (NITROSTAT) 0.4 MG SL TABLET    by SubLINGual route every five (5) minutes as needed for Chest Pain.    ONDANSETRON (ZOFRAN ODT) 4 MG DISINTEGRATING TABLET    Take 1 Tab by mouth every eight (8) hours as needed for Nausea.    ONDANSETRON (ZOFRAN ODT) 4 MG DISINTEGRATING TABLET    Take 1 Tab by mouth every eight (8) hours as needed for Nausea.    TELMISARTAN (MICARDIS) 20 MG TABLET    Take 20 mg by mouth daily. Indications: HYPERTENSION    TOLTERODINE (DETROL) 2 MG TABLET    Take 2 mg by mouth two (2) times a day.    TRAZODONE (DESYREL) 50 MG TABLET    Take  by mouth nightly.   These Medications have changed    No medications on file   Stop Taking     CEVIMELINE (EVOXAC) 30 MG CAPSULE    Take 30 mg by mouth three (3) times daily. Indications: XEROSTOMIA SECONDARY TO SJOGREN'S SYNDROME    FERROUS SULFATE (IRON) 325 MG (65 MG IRON) TABLET    Take  by mouth Daily (before breakfast).    HYDROXYCHLOROQUINE (PLAQUENIL) 200 MG TABLET    Take 200 mg by mouth daily.    MAGNESIUM 200 MG TAB    Take  by mouth daily.    MIRABEGRON (MYRBETRIQ) 50 MG TB24    Take 50 mg by mouth daily.    MIRABEGRON (MYRBETRIQ) 50 MG TB24    Take 50 mg by mouth daily.    OXYCODONE IR (ROXICODONE) 5 MG IMMEDIATE RELEASE TABLET    Take 5 mg by mouth every four (4) hours as needed for Pain.    PEG 400-PROPYLENE GLYCOL (SYSTANE, PROPYLENE GLYCOL,) 0.4-0.3 % DROP    Administer  to left eye as needed.    ZOLPIDEM (AMBIEN) 5 MG TABLET    Take  by mouth nightly as needed for Sleep.     3.   Follow-up Information     Follow up With Details Comments Contact Info    Esperanza Richters, DO Schedule an appointment as soon as possible for a visit in 2 days For primary care follow up [address] [place] [address]  [phone]      [facility] EMERGENCY DEPT  As needed, If symptoms worsen [address]  [place] [address]  [phone]        Return if sxs worsen    ATTESTATIONS:  This note is prepared by Nadine Counts, acting as Scribe for Tami Ribas, MD.    Tami Ribas, MD: The scribe's documentation has been prepared under my direction and personally reviewed by me in its entirety. I confirm that the note above accurately reflects all work, treatment, procedures, and medical decision making performed by me.

## 2016-04-03 NOTE — ED Notes (Signed)
discharged per ambulatory, no acute distress on discharge, written inst with rx x 1 given to pt, verbalizes understanding  Patient armband removed and shredded

## 2016-04-03 NOTE — ED Triage Notes (Addendum)
C/o dizziness, cough, sinus congestion for a couple of days, denies fever.  Sepsis Screening completed    (  )Patient meets SIRS criteria.  (x  )Patient does not meet SIRS criteria.      SIRS Criteria is achieved when two or more of the following are present  ? Temperature < 96.8??F (36??C) or > 100.9??F (38.3??C)  ? Heart Rate > 90 beats per minute  ? Respiratory Rate > 20 breaths per minute  ? WBC count > 12,000 or <4,000 or > 10% bands

## 2016-05-15 ENCOUNTER — Inpatient Hospital Stay: Admit: 2016-05-15 | Payer: MEDICARE | Primary: Family Medicine

## 2016-05-15 ENCOUNTER — Encounter

## 2016-05-15 DIAGNOSIS — Z01818 Encounter for other preprocedural examination: Secondary | ICD-10-CM

## 2016-05-15 LAB — EKG 12-LEAD
Atrial Rate: 73 {beats}/min
Diagnosis: NORMAL
P Axis: 77 degrees
P-R Interval: 178 ms
Q-T Interval: 424 ms
QRS Duration: 138 ms
QTc Calculation (Bazett): 467 ms
T Axis: 113 degrees
Ventricular Rate: 73 {beats}/min

## 2016-05-15 LAB — EKG, 12 LEAD, INITIAL
Atrial Rate: 73 {beats}/min
Calculated P Axis: 77 degrees
Calculated T Axis: 113 degrees
Diagnosis: NORMAL
P-R Interval: 178 ms
Q-T Interval: 424 ms
QRS Duration: 138 ms
QTC Calculation (Bezet): 467 ms
Ventricular Rate: 73 {beats}/min

## 2016-05-15 LAB — MIH FAX RESULT: FAX TO NUMBER: 8330062

## 2016-05-15 LAB — HEMATOCRIT: HCT: 35.3 % (ref 35.0–45.0)

## 2016-05-15 LAB — POTASSIUM: Potassium: 4.3 mmol/L (ref 3.5–5.5)

## 2016-05-15 LAB — HEMOGLOBIN: HGB: 11.9 g/dL — ABNORMAL LOW (ref 12.0–16.0)

## 2016-06-28 ENCOUNTER — Inpatient Hospital Stay: Admit: 2016-06-28 | Payer: MEDICARE | Attending: Podiatrist | Primary: Family Medicine

## 2016-06-28 ENCOUNTER — Encounter

## 2016-06-28 DIAGNOSIS — Z01812 Encounter for preprocedural laboratory examination: Secondary | ICD-10-CM

## 2016-06-28 LAB — POTASSIUM: Potassium: 4.3 mmol/L (ref 3.5–5.5)

## 2016-06-28 LAB — MIH FAX RESULT: FAX TO NUMBER: 8330062

## 2016-06-28 LAB — HGB & HCT
HCT: 36 % (ref 35.0–45.0)
HGB: 12.1 g/dL (ref 12.0–16.0)

## 2016-07-28 ENCOUNTER — Emergency Department: Admit: 2016-07-29 | Payer: MEDICARE | Primary: Family Medicine

## 2016-07-28 DIAGNOSIS — S20212A Contusion of left front wall of thorax, initial encounter: Secondary | ICD-10-CM

## 2016-07-28 NOTE — ED Provider Notes (Signed)
HPI Comments: 9:05 PM     Marissa Harrell is a 82 y.o. female presenting to the ED C/O fall forward yesterday when she was walking on a boat and tripped over her shoes. Reports that the point of impact was her chest and face. Associated sxs include pain and bruising to her left anterior chest. States that her chest pain waxes and wanes. Pt also reports a fall 1 week ago. Denies hx of CVA, CHF, or MI. Shx includes appendectomy. Reports cigarette cessation "many years ago". Pt denies LOC, abdominal pain, and any other symptoms or complaints.    Written by Ned Clines, ED Scribe, as dictated by Smitty Knudsen, MD     Patient is a 82 y.o. female presenting with fall.   Fall   The accident occurred yesterday. The fall occurred while walking. She fell from a height of ground level. Pertinent negatives include no abdominal pain. Associated symptoms comments: Chest pain.        Past Medical History:   Diagnosis Date   ??? Arthritis    ??? CAD (coronary artery disease)     s/p stents, EF 33%   ??? Chronic kidney disease     renal artery stenosis   ??? Esophageal reflux 11/05/2012   ??? GERD (gastroesophageal reflux disease)     well controlled   ??? Heart attack ([place])    ??? Hypertension    ??? Nausea & vomiting    ??? OAB (overactive bladder) 08/10/2013    82 y.o. female who presents today for s/p bladder in the [employer] > 10 years ago Had problems with catheter . Now if under stress c/o OAB Tried Toviaz without any help Now on detrol LA Has some mild SUI , mostly OAB Had urge incontinence once . Marland Kitchen Has Sjogren's syndrome [710.2] and c/o dry mouth. Will try myrbetriq 25/50 mg Nocturia 3-4     ??? Psychiatric disorder     anxiety   ??? Sjogren's syndrome ([place])        Past Surgical History:   Procedure Laterality Date   ??? CARDIAC SURG PROCEDURE UNLIST      stent x 3   ??? HX APPENDECTOMY     ??? HX BACK SURGERY     ??? HX CATARACT REMOVAL      bilateral   ??? HX CERVICAL FUSION     ??? HX ORTHOPAEDIC      [place]   ??? HX UROLOGICAL       bladder suspension         History reviewed. No pertinent family history.    Social History     Social History   ??? Marital status: WIDOWED     Spouse name: N/A   ??? Number of children: N/A   ??? Years of education: N/A     Occupational History   ??? Not on file.     Social History Main Topics   ??? Smoking status: Former Smoker   ??? Smokeless tobacco: Never Used   ??? Alcohol use Yes      Comment: 1 per week   ??? Drug use: No   ??? Sexual activity: Not Currently     Partners: Male     Other Topics Concern   ??? Not on file     Social History Narrative         ALLERGIES: Codeine    Review of Systems   Cardiovascular: Positive for chest pain (left anterior).   Gastrointestinal: Negative for abdominal pain.  Neurological: Negative for syncope.   All other systems reviewed and are negative.      Vitals:    07/28/16 2057 07/28/16 2100   BP: (!) 181/115 (!) 179/99   Pulse: 92 89   Resp: 16 17   Temp: 97.8 ??F (36.6 ??C)    SpO2: 97% 99%   Weight: 44.9 kg (99 lb)    Height: 5' 1" (1.549 m)             Physical Exam   Constitutional: She is oriented to person, place, and time. She appears well-developed and well-nourished. No distress.   Elderly female   HENT:   Head: Normocephalic and atraumatic.   Eyes: Pupils are equal, round, and reactive to light.   Neck: Neck supple.   Cardiovascular: Normal rate, regular rhythm, S1 normal and S2 normal.    Murmur heard.   Systolic murmur is present with a grade of 2/6   Pulmonary/Chest: Breath sounds normal. No respiratory distress. She has no wheezes. She has no rales. She exhibits tenderness (left upper chest wall). She exhibits no crepitus.   Bruising over the manubrium and left breast   Abdominal: Soft. She exhibits no distension and no mass. There is no tenderness. There is no guarding.   Musculoskeletal: Normal range of motion. She exhibits no edema or tenderness.   Neurological: She is alert and oriented to person, place, and time. No cranial nerve deficit.   CN 2-12 intact    Skin: No rash noted.   Psychiatric: She has a normal mood and affect. Her behavior is normal. Thought content normal.   Nursing note and vitals reviewed.       RESULTS:    CARDIAC MONITOR NOTE:  Cardiac Rhythm: NSR  Rate: 90 bpm     PULSE OXIMETRY NOTE:  Pulse-ox is 96% on room air  Interpretation: normal       EKG interpretation: (Preliminary)  9:00 PM   NSR. Rate 88 bpm. Left axis deviation. Left BBB.   EKG read by Audrionna Lampton, MD     XR RIBS LT W PA CXR MIN 3 V    (Results Pending)   XR CHEST SNGL V    (Results Pending)       10:27 PM  RADIOLOGY FINDINGS  Chest X-ray shows no acute process  Pending review by Radiologist  Recorded by Ned Clines, ED Scribe, as dictated by Smitty Knudsen, MD     10:27 PM  RADIOLOGY FINDINGS  Left ribs X-ray shows no acute process  Pending review by Radiologist  Recorded by Ned Clines, ED Scribe, as dictated by Smitty Knudsen, MD      Labs Reviewed   CBC WITH AUTOMATED DIFF - Abnormal; Notable for the following:        Result Value    RBC 4.02 (*)     All other components within normal limits   URINALYSIS W/ RFLX MICROSCOPIC - Abnormal; Notable for the following:     Blood TRACE (*)     Leukocyte Esterase TRACE (*)     All other components within normal limits   METABOLIC PANEL, COMPREHENSIVE - Abnormal; Notable for the following:     Sodium 134 (*)     Chloride 99 (*)     Glucose 111 (*)     Globulin 4.1 (*)     All other components within normal limits   URINE MICROSCOPIC ONLY   CARDIAC PANEL,(CK, CKMB & TROPONIN)       Recent  Results (from the past 12 hour(s))   EKG, 12 LEAD, INITIAL    Collection Time: 07/28/16  9:00 PM   Result Value Ref Range    Ventricular Rate 88 BPM    Atrial Rate 88 BPM    P-R Interval 176 ms    QRS Duration 136 ms    Q-T Interval 390 ms    QTC Calculation (Bezet) 471 ms    Calculated P Axis 76 degrees    Calculated R Axis -38 degrees    Calculated T Axis 118 degrees    Diagnosis       Normal sinus rhythm  Left axis deviation   Left bundle branch block  Abnormal ECG  When compared with ECG of 15-May-2016 09:56,  T wave amplitude has decreased in Anterior leads  T wave inversion less evident in Lateral leads     CBC WITH AUTOMATED DIFF    Collection Time: 07/28/16  9:07 PM   Result Value Ref Range    WBC 8.3 4.6 - 13.2 K/uL    RBC 4.02 (L) 4.20 - 5.30 M/uL    HGB 12.6 12.0 - 16.0 g/dL    HCT 37.3 35.0 - 45.0 %    MCV 92.8 74.0 - 97.0 FL    MCH 31.3 24.0 - 34.0 PG    MCHC 33.8 31.0 - 37.0 g/dL    RDW 14.3 11.6 - 14.5 %    PLATELET 316 135 - 420 K/uL    MPV 9.4 9.2 - 11.8 FL    NEUTROPHILS 51 40 - 73 %    LYMPHOCYTES 38 21 - 52 %    MONOCYTES 8 3 - 10 %    EOSINOPHILS 2 0 - 5 %    BASOPHILS 1 0 - 2 %    ABS. NEUTROPHILS 4.3 1.8 - 8.0 K/UL    ABS. LYMPHOCYTES 3.1 0.9 - 3.6 K/UL    ABS. MONOCYTES 0.7 0.05 - 1.2 K/UL    ABS. EOSINOPHILS 0.1 0.0 - 0.4 K/UL    ABS. BASOPHILS 0.0 0.0 - 0.06 K/UL    DF AUTOMATED     URINALYSIS W/ RFLX MICROSCOPIC    Collection Time: 07/28/16  9:15 PM   Result Value Ref Range    Color YELLOW      Appearance CLEAR      Specific gravity 1.005 1.005 - 1.030      pH (UA) 7.0 5.0 - 8.0      Protein NEGATIVE  NEG mg/dL    Glucose NEGATIVE  NEG mg/dL    Ketone NEGATIVE  NEG mg/dL    Bilirubin NEGATIVE  NEG      Blood TRACE (A) NEG      Urobilinogen 0.2 0.2 - 1.0 EU/dL    Nitrites NEGATIVE  NEG      Leukocyte Esterase TRACE (A) NEG     URINE MICROSCOPIC ONLY    Collection Time: 07/28/16  9:15 PM   Result Value Ref Range    WBC 0 to 1 0 - 5 /hpf    RBC NEGATIVE  0 - 5 /hpf    Epithelial cells FEW 0 - 5 /lpf    Bacteria NEGATIVE  NEG /hpf    Mucus NEGATIVE  NEG /lpf   METABOLIC PANEL, COMPREHENSIVE    Collection Time: 07/28/16  9:50 PM   Result Value Ref Range    Sodium 134 (L) 136 - 145 mmol/L    Potassium 3.7 3.5 - 5.5 mmol/L    Chloride 99 (  L) 100 - 108 mmol/L    CO2 28 21 - 32 mmol/L    Anion gap 7 3.0 - 18 mmol/L    Glucose 111 (H) 74 - 99 mg/dL    BUN 10 7.0 - 18 MG/DL    Creatinine 0.80 0.6 - 1.3 MG/DL     BUN/Creatinine ratio 13 12 - 20      GFR est AA >60 >60 ml/min/1.103m    GFR est non-AA >60 >60 ml/min/1.748m   Calcium 8.9 8.5 - 10.1 MG/DL    Bilirubin, total 0.5 0.2 - 1.0 MG/DL    ALT (SGPT) 47 13 - 56 U/L    AST (SGOT) 28 15 - 37 U/L    Alk. phosphatase 55 45 - 117 U/L    Protein, total 7.7 6.4 - 8.2 g/dL    Albumin 3.6 3.4 - 5.0 g/dL    Globulin 4.1 (H) 2.0 - 4.0 g/dL    A-G Ratio 0.9 0.8 - 1.7     CARDIAC PANEL,(CK, CKMB & TROPONIN)    Collection Time: 07/28/16  9:50 PM   Result Value Ref Range    CK 68 26 - 192 U/L    CK - MB 1.6 <3.6 ng/ml    CK-MB Index 2.4 0.0 - 4.0 %    Troponin-I, Qt. 0.04 0.00 - 0.06 NG/ML        MDM  Number of Diagnoses or Management Options  Diagnosis management comments: INITIAL CLINICAL IMPRESSION and PLANS:  The patient presents with the primary complaint(s) of: chest pain. The presentation, to include historical aspects and clinical findings are consistent with the DX of chest wall pain. However, other possible DX's to consider as primary, associated with, or exacerbated by include:    1.  Rib fracture  2.  Chest contusion  3.  Cardiac contusion  4.  CHF  5.  Pericarditis  6.  MI    Considering the above, my initial management plan to evaluate and therapeutic interventions include the following and as noted in the orders:    1.  Labs: Urinalysis, CBC, Cardiac Panel, CMP  2.  Imaging: EKG, Left ribs X-ray, Chest X-ray  3.  Medications: Ofirmev    Recorded by ChNed ClinesED Scribe, as dictated by JoSmitty KnudsenMD.        Amount and/or Complexity of Data Reviewed  Clinical lab tests: reviewed and ordered  Tests in the radiology section of CPT??: reviewed and ordered (Left ribs X-ray, Chest X-ray)  Tests in the medicine section of CPT??: reviewed and ordered (EKG)  Independent visualization of images, tracings, or specimens: yes (Chest X-ray, Left ribs x-ray, EKG)      ED Course     MEDICATIONS GIVEN:  Medications    acetaminophen (OFIRMEV) infusion 1,000 mg (0 mg IntraVENous IV Completed 07/28/16 2221)        Procedures    PROGRESS NOTE:  9:05 PM  Initial assessment performed.  Written by ChNed ClinesED Scribe, as dictated by JoSmitty KnudsenMD    DISCHARGE NOTE:  10:28 PM   InKarlyne Greenspanresults have been reviewed with her.  She has been counseled regarding her diagnosis, treatment, and plan.  She verbally conveys understanding and agreement of the signs, symptoms, diagnosis, treatment and prognosis and additionally agrees to follow up as discussed.  She also agrees with the care-plan and conveys that all of her questions have been answered.  I have also provided discharge instructions for her that include:  educational information regarding their diagnosis and treatment, and list of reasons why they would want to return to the ED prior to their follow-up appointment, should her condition change. The patient and/or family has been provided with education for proper Emergency Department utilization.    CLINICAL IMPRESSION:    1. Chest wall pain    2. Pain    3. Multiple bruises        PLAN: DISCHARGE HOME    Follow-up Information     Follow up With Details Comments Contact Info    Berline Chough, DO Schedule an appointment as soon as possible for a visit in 2 days Or your primary care provider for follow up [place] [address]  [phone]      [facility] EMERGENCY DEPT  As needed, If symptoms worsen [address]  [place] [address]  [phone]          Current Discharge Medication List      START taking these medications    Details   traMADol (ULTRAM) 50 mg tablet Take 1 Tab by mouth every six (6) hours as needed for Pain. Max Daily Amount: 200 mg.  Qty: 20 Tab, Refills: 0             ATTESTATIONS:  This note is prepared by Ned Clines, acting as Scribe for [employer], MD.    Smitty Knudsen, MD: The scribe's documentation has been prepared under  my direction and personally reviewed by me in its entirety. I confirm that the note above accurately reflects all work, treatment, procedures, and medical decision making performed by me.

## 2016-07-28 NOTE — ED Notes (Signed)
I have reviewed discharge instructions with the patient.  The patient verbalized understanding. Patient armband removed and shredded.     patient given 1 prescriptions. Discussed side effects with patient. patient verbalized understanding. Patient wheeled to lobby by this RN with visitor. Patient discharged in stable condition to care of self.

## 2016-07-28 NOTE — ED Triage Notes (Signed)
Pt fell on boat yesterday and has pain in chest. Bruising noted on chest and to left eye. Sepsis Screening completed    (  )Patient meets SIRS criteria.  (x  )Patient does not meet SIRS criteria.      SIRS Criteria is achieved when two or more of the following are present  ? Temperature < 96.8??F (36??C) or > 100.9??F (38.3??C)  ? Heart Rate > 90 beats per minute  ? Respiratory Rate > 20 breaths per minute  ? WBC count > 12,000 or <4,000 or > 10% bands

## 2016-07-28 NOTE — ED Notes (Signed)
Ice chips provided for reports of dry  Mouth.

## 2016-07-29 ENCOUNTER — Inpatient Hospital Stay
Admit: 2016-07-29 | Discharge: 2016-07-29 | Disposition: A | Discharge status: Home / Self Care | Payer: MEDICARE | Attending: Emergency Medicine

## 2016-07-29 LAB — CBC WITH AUTOMATED DIFF
ABS. BASOPHILS: 0 10*3/uL (ref 0.0–0.06)
ABS. EOSINOPHILS: 0.1 10*3/uL (ref 0.0–0.4)
ABS. LYMPHOCYTES: 3.1 10*3/uL (ref 0.9–3.6)
ABS. MONOCYTES: 0.7 10*3/uL (ref 0.05–1.2)
ABS. NEUTROPHILS: 4.3 10*3/uL (ref 1.8–8.0)
BASOPHILS: 1 % (ref 0–2)
EOSINOPHILS: 2 % (ref 0–5)
HCT: 37.3 % (ref 35.0–45.0)
HGB: 12.6 g/dL (ref 12.0–16.0)
LYMPHOCYTES: 38 % (ref 21–52)
MCH: 31.3 PG (ref 24.0–34.0)
MCHC: 33.8 g/dL (ref 31.0–37.0)
MCV: 92.8 FL (ref 74.0–97.0)
MONOCYTES: 8 % (ref 3–10)
MPV: 9.4 FL (ref 9.2–11.8)
NEUTROPHILS: 51 % (ref 40–73)
PLATELET: 316 10*3/uL (ref 135–420)
RBC: 4.02 M/uL — ABNORMAL LOW (ref 4.20–5.30)
RDW: 14.3 % (ref 11.6–14.5)
WBC: 8.3 10*3/uL (ref 4.6–13.2)

## 2016-07-29 LAB — URINALYSIS W/ RFLX MICROSCOPIC
Bilirubin: NEGATIVE
Glucose: NEGATIVE mg/dL
Ketone: NEGATIVE mg/dL
Nitrites: NEGATIVE
Protein: NEGATIVE mg/dL
Specific gravity: 1.005 (ref 1.005–1.030)
Urobilinogen: 0.2 EU/dL (ref 0.2–1.0)
pH (UA): 7 (ref 5.0–8.0)

## 2016-07-29 LAB — METABOLIC PANEL, COMPREHENSIVE
A-G Ratio: 0.9 (ref 0.8–1.7)
ALT (SGPT): 47 U/L (ref 13–56)
AST (SGOT): 28 U/L (ref 15–37)
Albumin: 3.6 g/dL (ref 3.4–5.0)
Alk. phosphatase: 55 U/L (ref 45–117)
Anion gap: 7 mmol/L (ref 3.0–18)
BUN/Creatinine ratio: 13 (ref 12–20)
BUN: 10 MG/DL (ref 7.0–18)
Bilirubin, total: 0.5 MG/DL (ref 0.2–1.0)
CO2: 28 mmol/L (ref 21–32)
Calcium: 8.9 MG/DL (ref 8.5–10.1)
Chloride: 99 mmol/L — ABNORMAL LOW (ref 100–108)
Creatinine: 0.8 MG/DL (ref 0.6–1.3)
GFR est AA: >60 mL/min/{1.73_m2} (ref >60)
GFR est non-AA: >60 mL/min/{1.73_m2} (ref >60)
Globulin: 4.1 g/dL — ABNORMAL HIGH (ref 2.0–4.0)
Glucose: 111 mg/dL — ABNORMAL HIGH (ref 74–99)
Potassium: 3.7 mmol/L (ref 3.5–5.5)
Protein, total: 7.7 g/dL (ref 6.4–8.2)
Sodium: 134 mmol/L — ABNORMAL LOW (ref 136–145)

## 2016-07-29 LAB — URINE MICROSCOPIC ONLY
Bacteria: NEGATIVE /hpf
Mucus: NEGATIVE /lpf
RBC: NEGATIVE /hpf (ref 0–5)
WBC: 0 /hpf (ref 0–5)

## 2016-07-29 LAB — CARDIAC PANEL,(CK, CKMB & TROPONIN)
CK - MB: 1.6 ng/ml (ref <3.6)
CK-MB Index: 2.4 % (ref 0.0–4.0)
CK: 68 U/L (ref 26–192)
Troponin-I, QT: 0.04 NG/ML (ref 0.00–0.06)

## 2016-07-29 MED ORDER — ACETAMINOPHEN 1,000 MG/100 ML (10 MG/ML) IV
1000 mg/100 mL (10 mg/mL) | Freq: Once | INTRAVENOUS | Status: AC
Start: 2016-07-29 — End: 2016-07-28
  Administered 2016-07-29: 02:00:00 via INTRAVENOUS

## 2016-07-29 MED ORDER — TRAMADOL 50 MG TAB
50 mg | ORAL | Status: AC
Start: 2016-07-29 — End: 2016-07-28
  Administered 2016-07-29: 03:00:00 via ORAL

## 2016-07-29 MED ORDER — TRAMADOL 50 MG TAB
50 mg | ORAL_TABLET | Freq: Four times a day (QID) | ORAL | 0 refills | Status: DC | PRN
Start: 2016-07-29 — End: 2017-04-07

## 2016-07-29 MED FILL — TRAMADOL 50 MG TAB: 50 mg | ORAL | Qty: 1

## 2016-07-29 MED FILL — OFIRMEV 1,000 MG/100 ML (10 MG/ML) INTRAVENOUS SOLUTION: 1000 mg/100 mL (10 mg/mL) | INTRAVENOUS | Qty: 100

## 2016-08-03 LAB — EKG 12-LEAD
Atrial Rate: 88 {beats}/min
Diagnosis: NORMAL
P Axis: 76 degrees
P-R Interval: 176 ms
Q-T Interval: 390 ms
QRS Duration: 136 ms
QTc Calculation (Bazett): 471 ms
R Axis: -38 degrees
T Axis: 118 degrees
Ventricular Rate: 88 {beats}/min

## 2016-08-03 LAB — EKG, 12 LEAD, INITIAL
Atrial Rate: 88 {beats}/min
Calculated P Axis: 76 degrees
Calculated R Axis: -38 degrees
Calculated T Axis: 118 degrees
Diagnosis: NORMAL
P-R Interval: 176 ms
Q-T Interval: 390 ms
QRS Duration: 136 ms
QTC Calculation (Bezet): 471 ms
Ventricular Rate: 88 {beats}/min

## 2016-10-19 ENCOUNTER — Inpatient Hospital Stay
Admit: 2016-10-19 | Discharge: 2016-10-19 | Disposition: A | Discharge status: Home / Self Care | Payer: MEDICARE | Attending: Emergency Medicine

## 2016-10-19 ENCOUNTER — Emergency Department: Admit: 2016-10-19 | Payer: MEDICARE | Primary: Family Medicine

## 2016-10-19 DIAGNOSIS — I10 Essential (primary) hypertension: Secondary | ICD-10-CM

## 2016-10-19 LAB — EKG 12-LEAD
Atrial Rate: 69 {beats}/min
Diagnosis: NORMAL
P Axis: 69 degrees
P-R Interval: 172 ms
Q-T Interval: 430 ms
QRS Duration: 144 ms
QTc Calculation (Bazett): 460 ms
R Axis: -37 degrees
T Axis: 127 degrees
Ventricular Rate: 69 {beats}/min

## 2016-10-19 LAB — EKG, 12 LEAD, INITIAL
Atrial Rate: 69 {beats}/min
Calculated P Axis: 69 degrees
Calculated R Axis: -37 degrees
Calculated T Axis: 127 degrees
Diagnosis: NORMAL
P-R Interval: 172 ms
Q-T Interval: 430 ms
QRS Duration: 144 ms
QTC Calculation (Bezet): 460 ms
Ventricular Rate: 69 {beats}/min

## 2016-10-19 LAB — URINALYSIS W/ RFLX MICROSCOPIC
Bilirubin: NEGATIVE
Blood: NEGATIVE
Glucose: NEGATIVE mg/dL
Ketone: NEGATIVE mg/dL
Leukocyte Esterase: NEGATIVE
Nitrites: NEGATIVE
Protein: NEGATIVE mg/dL
Specific gravity: <1.005 — ABNORMAL LOW (ref 1.005–1.030)
Urobilinogen: 0.2 EU/dL (ref 0.2–1.0)
pH (UA): 8 (ref 5.0–8.0)

## 2016-10-19 LAB — METABOLIC PANEL, COMPREHENSIVE
A-G Ratio: 1 (ref 0.8–1.7)
ALT (SGPT): 26 U/L (ref 13–56)
AST (SGOT): 19 U/L (ref 15–37)
Albumin: 3.8 g/dL (ref 3.4–5.0)
Alk. phosphatase: 45 U/L (ref 45–117)
Anion gap: 7 mmol/L (ref 3.0–18)
BUN/Creatinine ratio: 21 — ABNORMAL HIGH (ref 12–20)
BUN: 16 MG/DL (ref 7.0–18)
Bilirubin, total: 0.5 MG/DL (ref 0.2–1.0)
CO2: 29 mmol/L (ref 21–32)
Calcium: 9.3 MG/DL (ref 8.5–10.1)
Chloride: 100 mmol/L (ref 100–108)
Creatinine: 0.77 MG/DL (ref 0.6–1.3)
GFR est AA: >60 mL/min/{1.73_m2} (ref >60)
GFR est non-AA: >60 mL/min/{1.73_m2} (ref >60)
Globulin: 3.7 g/dL (ref 2.0–4.0)
Glucose: 99 mg/dL (ref 74–99)
Potassium: 4.3 mmol/L (ref 3.5–5.5)
Protein, total: 7.5 g/dL (ref 6.4–8.2)
Sodium: 136 mmol/L (ref 136–145)

## 2016-10-19 LAB — CBC WITH AUTOMATED DIFF
ABS. BASOPHILS: 0 10*3/uL (ref 0.0–0.06)
ABS. EOSINOPHILS: 0.1 10*3/uL (ref 0.0–0.4)
ABS. LYMPHOCYTES: 3.7 10*3/uL — ABNORMAL HIGH (ref 0.9–3.6)
ABS. MONOCYTES: 0.8 10*3/uL (ref 0.05–1.2)
ABS. NEUTROPHILS: 3.8 10*3/uL (ref 1.8–8.0)
BASOPHILS: 1 % (ref 0–2)
EOSINOPHILS: 2 % (ref 0–5)
HCT: 37.6 % (ref 35.0–45.0)
HGB: 12.8 g/dL (ref 12.0–16.0)
LYMPHOCYTES: 43 % (ref 21–52)
MCH: 31.5 PG (ref 24.0–34.0)
MCHC: 34 g/dL (ref 31.0–37.0)
MCV: 92.6 FL (ref 74.0–97.0)
MONOCYTES: 10 % (ref 3–10)
MPV: 9.5 FL (ref 9.2–11.8)
NEUTROPHILS: 44 % (ref 40–73)
PLATELET: 304 10*3/uL (ref 135–420)
RBC: 4.06 M/uL — ABNORMAL LOW (ref 4.20–5.30)
RDW: 13.9 % (ref 11.6–14.5)
WBC: 8.5 10*3/uL (ref 4.6–13.2)

## 2016-10-19 LAB — CARDIAC PANEL,(CK, CKMB & TROPONIN)
CK - MB: 1.7 ng/ml (ref <3.6)
CK-MB Index: 2.5 % (ref 0.0–4.0)
CK: 67 U/L (ref 26–192)
Troponin-I, QT: <0.02 NG/ML (ref 0.00–0.06)

## 2016-10-19 MED ORDER — ACETAMINOPHEN 325 MG TABLET
325 mg | Freq: Once | ORAL | Status: AC
Start: 2016-10-19 — End: 2016-10-19
  Administered 2016-10-19: 21:00:00 via ORAL

## 2016-10-19 MED ORDER — METOPROLOL TARTRATE 5 MG/5 ML IV SOLN
5 mg/ mL | INTRAVENOUS | Status: AC
Start: 2016-10-19 — End: 2016-10-19
  Administered 2016-10-19: 21:00:00 via INTRAVENOUS

## 2016-10-19 MED ORDER — ACETAMINOPHEN 325 MG TABLET
325 mg | ORAL | Status: DC
Start: 2016-10-19 — End: 2016-10-19

## 2016-10-19 MED FILL — ACETAMINOPHEN 325 MG TABLET: 325 mg | ORAL | Qty: 2

## 2016-10-19 MED FILL — METOPROLOL TARTRATE 5 MG/5 ML IV SOLN: 5 mg/ mL | INTRAVENOUS | Qty: 5

## 2016-10-19 NOTE — ED Triage Notes (Signed)
Pt reports HTN that is not responding to current medication prescribed. Pt reports dizziness, nausea, and headache, denies SOB or chest pain.     Sepsis Screening completed    (  ) Patient meets SIRS criteria.  (X) Patient does not meet SIRS criteria.      SIRS Criteria is achieved when two or more of the following are present  ? Temperature < 96.8??F (36??C) or > 100.9??F (38.3??C)  ? Heart Rate > 90 beats per minute  ? Respiratory Rate > 20 breaths per minute  ? WBC count > 12,000 or <4,000 or > 10% bands

## 2016-10-19 NOTE — ED Provider Notes (Signed)
[facility]  EMERGENCY DEPARTMENT HISTORY AND PHYSICAL EXAM       Date: 10/19/2016   Patient Name: Marissa Harrell   Date of Birth: 06-Sep-1928  Medical Record Number: [id number]    History of Presenting Illness     Chief Complaint   Patient presents with   ??? Hypertension        History Provided By:  patient    Additional History: 4:52 PM  Marissa Harrell is a 82 y.o. female with a PMHx of HTN who presents to the emergency department C/O high blood pressure which was noticed this morning, states her blood pressure has not been checked recently. Associated symptoms include dizziness, HA, nausea, loss of appetite, generalized weakness, increased urinary frequency (states this has been going on for a while), pedal edema (in the mornings). Pt states that she took her blood pressure medication today, No hx of DM. Pt has a hx of cardiac stent placement. Patient denies CP, SOB, vomiting, fever, diarrhea, and any other symptoms or complaints.    Primary Care Provider: Phys Other, MD   Specialist:    Past History     Past Medical History:   Past Medical History:   Diagnosis Date   ??? Arthritis    ??? CAD (coronary artery disease)     s/p stents, EF 33%   ??? Chronic kidney disease     renal artery stenosis   ??? Esophageal reflux 11/05/2012   ??? GERD (gastroesophageal reflux disease)     well controlled   ??? Heart attack    ??? Hypertension    ??? Nausea & vomiting    ??? OAB (overactive bladder) 08/10/2013    82 y.o. female who presents today for s/p bladder in the [employer] > 10 years ago Had problems with catheter . Now if under stress c/o OAB Tried Toviaz without any help Now on detrol LA Has some mild SUI , mostly OAB Had urge incontinence once . Marland Kitchen Has Sjogren's syndrome [710.2] and c/o dry mouth. Will try myrbetriq 25/50 mg Nocturia 3-4     ??? Psychiatric disorder     anxiety   ??? Sjogren's syndrome ([place])         Past Surgical History:   Past Surgical History:   Procedure Laterality Date    ??? CARDIAC SURG PROCEDURE UNLIST      stent x 3   ??? HX APPENDECTOMY     ??? HX BACK SURGERY     ??? HX CATARACT REMOVAL      bilateral   ??? HX CERVICAL FUSION     ??? HX ORTHOPAEDIC      Elbow   ??? HX UROLOGICAL      bladder suspension        Family History:   History reviewed. No pertinent family history.     Social History:   Social History   Substance Use Topics   ??? Smoking status: Former Smoker   ??? Smokeless tobacco: Never Used   ??? Alcohol use Yes      Comment: 1 per week        Allergies:   Allergies   Allergen Reactions   ??? Codeine Other (comments)     Gets very hyper        Review of Systems   Review of Systems   Constitutional: Positive for appetite change. Negative for fever.   Respiratory: Negative for shortness of breath.    Cardiovascular: Positive for leg swelling.  Negative for chest pain.        (+) high blood pressure   Gastrointestinal: Positive for nausea. Negative for diarrhea and vomiting.   Genitourinary: Positive for frequency.   Neurological: Positive for dizziness, weakness (generalized) and headaches.   All other systems reviewed and are negative.      Physical Exam  Vitals:    10/19/16 1657 10/19/16 1700 10/19/16 1755   BP: (!) 195/109 (!) 187/108 (!) 163/93   Pulse: 72 71 64   Resp: '16 14 14   '$ Temp: 98.1 ??F (36.7 ??C)     SpO2: 100% 98% 98%   Weight: 44.5 kg (98 lb)     Height: '5\' 1"'$  (1.549 m)         Physical Exam   Constitutional: She is oriented to person, place, and time. She appears well-developed and well-nourished. No distress.   HENT:   Head: Normocephalic and atraumatic.   Mouth/Throat: Oropharynx is clear and moist.   Eyes: Conjunctivae are normal. Pupils are equal, round, and reactive to light. No scleral icterus.   Neck: Normal range of motion. Neck supple.   Cardiovascular: Intact distal pulses.    Capillary refill < 3 seconds   Pulmonary/Chest: Effort normal and breath sounds normal. No respiratory distress. She has no wheezes.    Abdominal: Soft. Bowel sounds are normal. She exhibits no distension. There is no tenderness.   Musculoskeletal: Normal range of motion. She exhibits no edema.   Lymphadenopathy:     She has no cervical adenopathy.   Neurological: She is alert and oriented to person, place, and time. She has normal strength. No cranial nerve deficit or sensory deficit. She exhibits normal muscle tone. Coordination normal.   No facial droop, no slurred speech. Strength 5/5. Sensation intact.   Skin: Skin is warm and dry. She is not diaphoretic.   Psychiatric: She has a normal mood and affect. Her behavior is normal.   Nursing note and vitals reviewed.      Diagnostic Study Results     Labs -      Recent Results (from the past 12 hour(s))   CBC WITH AUTOMATED DIFF    Collection Time: 10/19/16  5:02 PM   Result Value Ref Range    WBC 8.5 4.6 - 13.2 K/uL    RBC 4.06 (L) 4.20 - 5.30 M/uL    HGB 12.8 12.0 - 16.0 g/dL    HCT 37.6 35.0 - 45.0 %    MCV 92.6 74.0 - 97.0 FL    MCH 31.5 24.0 - 34.0 PG    MCHC 34.0 31.0 - 37.0 g/dL    RDW 13.9 11.6 - 14.5 %    PLATELET 304 135 - 420 K/uL    MPV 9.5 9.2 - 11.8 FL    NEUTROPHILS 44 40 - 73 %    LYMPHOCYTES 43 21 - 52 %    MONOCYTES 10 3 - 10 %    EOSINOPHILS 2 0 - 5 %    BASOPHILS 1 0 - 2 %    ABS. NEUTROPHILS 3.8 1.8 - 8.0 K/UL    ABS. LYMPHOCYTES 3.7 (H) 0.9 - 3.6 K/UL    ABS. MONOCYTES 0.8 0.05 - 1.2 K/UL    ABS. EOSINOPHILS 0.1 0.0 - 0.4 K/UL    ABS. BASOPHILS 0.0 0.0 - 0.06 K/UL    DF AUTOMATED     METABOLIC PANEL, COMPREHENSIVE    Collection Time: 10/19/16  5:02 PM   Result Value Ref Range  Sodium 136 136 - 145 mmol/L    Potassium 4.3 3.5 - 5.5 mmol/L    Chloride 100 100 - 108 mmol/L    CO2 29 21 - 32 mmol/L    Anion gap 7 3.0 - 18 mmol/L    Glucose 99 74 - 99 mg/dL    BUN 16 7.0 - 18 MG/DL    Creatinine 0.77 0.6 - 1.3 MG/DL    BUN/Creatinine ratio 21 (H) 12 - 20      GFR est AA >60 >60 ml/min/1.89m    GFR est non-AA >60 >60 ml/min/1.751m   Calcium 9.3 8.5 - 10.1 MG/DL     Bilirubin, total 0.5 0.2 - 1.0 MG/DL    ALT (SGPT) 26 13 - 56 U/L    AST (SGOT) 19 15 - 37 U/L    Alk. phosphatase 45 45 - 117 U/L    Protein, total 7.5 6.4 - 8.2 g/dL    Albumin 3.8 3.4 - 5.0 g/dL    Globulin 3.7 2.0 - 4.0 g/dL    A-G Ratio 1.0 0.8 - 1.7     CARDIAC PANEL,(CK, CKMB & TROPONIN)    Collection Time: 10/19/16  5:02 PM   Result Value Ref Range    CK 67 26 - 192 U/L    CK - MB 1.7 <3.6 ng/ml    CK-MB Index 2.5 0.0 - 4.0 %    Troponin-I, Qt. <0.02 0.00 - 0.06 NG/ML   EKG, 12 LEAD, INITIAL    Collection Time: 10/19/16  5:09 PM   Result Value Ref Range    Ventricular Rate 69 BPM    Atrial Rate 69 BPM    P-R Interval 172 ms    QRS Duration 144 ms    Q-T Interval 430 ms    QTC Calculation (Bezet) 460 ms    Calculated P Axis 69 degrees    Calculated R Axis -37 degrees    Calculated T Axis 127 degrees    Diagnosis       Normal sinus rhythm  Left axis deviation  Left bundle branch block  Abnormal ECG  Confirmed by SoBerton LanD, MrTamera [id number]) on 10/19/2016 5:40:04 PM     URINALYSIS W/ RFLX MICROSCOPIC    Collection Time: 10/19/16  5:30 PM   Result Value Ref Range    Color YELLOW      Appearance CLEAR      Specific gravity <1.005 (L) 1.005 - 1.030    pH (UA) 8.0 5.0 - 8.0      Protein NEGATIVE  NEG mg/dL    Glucose NEGATIVE  NEG mg/dL    Ketone NEGATIVE  NEG mg/dL    Bilirubin NEGATIVE  NEG      Blood NEGATIVE  NEG      Urobilinogen 0.2 0.2 - 1.0 EU/dL    Nitrites NEGATIVE  NEG      Leukocyte Esterase NEGATIVE  NEG         Radiologic Studies -  The following have been ordered and reviewed:  CT HEAD WO CONT   Final Result     IMPRESSION:  ??  No acute intracranial abnormalities    As read by the radiologist.             Medical Decision Making   I am the first provider for this patient.     I reviewed the vital signs, available nursing notes, past medical history, past surgical history, family history and social history.     Ddx:  Uncontrolled blood pressure, intracranial hemorrhage, HA from  increased blood pressure, metabolic, infection    CT head, labs, lopressor, on monitor    BP improved  CT head nothing acute    I have reassessed the patient. I have discussed the workup, results and plan with the patient and patient is in agreement.  Patient is feeling better.  Patient was discharge in stable condition. Patient was given outpatient follow up.  Patient is to return to emergency department if any new or worsening condition.     Vital Signs-Reviewed the patient's vital signs.   Patient Vitals for the past 12 hrs:   Temp Pulse Resp BP SpO2   10/19/16 1755 - 64 14 (!) 163/93 98 %   10/19/16 1700 - 71 14 (!) 187/108 98 %   10/19/16 1657 98.1 ??F (36.7 ??C) 72 16 (!) 195/109 100 %       Pulse Oximetry Analysis - Normal 100% on RA     EKG interpretation: (Preliminary)  Read at 5:13 PM by Blake Divine, DO  NSR; 69 bpm; LAD; LBBB  Written by Scheryl Darter, ED Scribe    Old Medical Records: Old medical records.  Nursing notes.       Procedures:   Procedures    ED Course:  4:52 PM  Initial assessment performed. The patients presenting problems have been discussed, and they are in agreement with the care plan formulated and outlined with them.  I have encouraged them to ask questions as they arise throughout their visit.       Medications Given in the ED:  Medications   acetaminophen (TYLENOL) tablet 650 mg (650 mg Oral Given 10/19/16 1720)   metoprolol (LOPRESSOR) injection 2.5 mg (2.5 mg IntraVENous Given 10/19/16 1720)       DISCHARGE NOTE:  6:19 PM  Marissa Harrell's  results have been reviewed with her.  She has been counseled regarding her diagnosis, treatment, and plan.  She verbally conveys understanding and agreement of the signs, symptoms, diagnosis, treatment and prognosis and additionally agrees to follow up as discussed.  She also agrees with the care-plan and conveys that all of her questions have been answered.  I have also provided discharge instructions for her  that include: educational information regarding their diagnosis and treatment, and list of reasons why they would want to return to the ED prior to their follow-up appointment, should her condition change.    PROGRESS NOTE:   6:14 PM  Pt has been re-examined by Blake Divine, DO. Pt states HA has much improved, blood pressure also improved after administering Lopressor    Diagnosis   Clinical Impression:   1. Elevated blood pressure reading with diagnosis of hypertension    2. Nonintractable episodic headache, unspecified headache type         Follow-up Information     Follow up With Details Comments Contact Info    Serena Croissant, MD Call Monday morning to schedule an appointment [place] [address]  [phone]      [facility] EMERGENCY DEPT  As needed, If symptoms worsen [address]  [place] [address]  [phone]          Current Discharge Medication List          _______________________________     SCRIBE ATTESTATION:  This note is prepared by Leighton Ruff. Tollis, acting as Education administrator for [employer], DO on 10/19/2016 .    PROVIDER ATTESTATION:  Blake Divine, DO: The  scribe's documentation has been prepared under my direction and personally reviewed by me in its entirety. I confirm that the note above accurately reflects all work, treatment, procedures, and medical decision making performed by me.

## 2016-10-19 NOTE — ED Notes (Signed)
Pt to CT on CCM. Pt in NAD at this time.

## 2016-10-19 NOTE — ED Notes (Signed)
Discharge instructions reviewed with the patient with opportunity for questions given. The patient verbalized understanding. Patient armband removed and shredded. Patient in stable condition.

## 2016-10-19 NOTE — ED Notes (Signed)
Pt back from from CT, resting in stretcher in NAD at this time. Call light within reach.

## 2016-10-20 LAB — TSH 3RD GENERATION: TSH: 2.06 u[IU]/mL (ref 0.36–3.74)

## 2017-03-07 ENCOUNTER — Inpatient Hospital Stay: Admit: 2017-03-07 | Payer: MEDICARE | Attending: Family | Primary: Family Medicine

## 2017-03-07 DIAGNOSIS — R6889 Other general symptoms and signs: Secondary | ICD-10-CM

## 2017-03-08 LAB — VITAMIN B12: Vitamin B12: 350 pg/mL (ref 211–911)

## 2017-03-08 LAB — MIH FAX RESULT: FAX TO NUMBER: 8729711

## 2017-04-07 ENCOUNTER — Inpatient Hospital Stay
Admit: 2017-04-07 | Discharge: 2017-04-07 | Disposition: A | Discharge status: Home / Self Care | Payer: MEDICARE | Attending: Emergency Medicine

## 2017-04-07 ENCOUNTER — Emergency Department: Admit: 2017-04-07 | Payer: MEDICARE | Primary: Family Medicine

## 2017-04-07 DIAGNOSIS — R4182 Altered mental status, unspecified: Secondary | ICD-10-CM

## 2017-04-07 LAB — METABOLIC PANEL, COMPREHENSIVE
A-G Ratio: 1 (ref 0.8–1.7)
ALT (SGPT): 21 U/L (ref 13–56)
AST (SGOT): 17 U/L (ref 15–37)
Albumin: 4 g/dL (ref 3.4–5.0)
Alk. phosphatase: 41 U/L — ABNORMAL LOW (ref 45–117)
Anion gap: 7 mmol/L (ref 3.0–18)
BUN/Creatinine ratio: 18 (ref 12–20)
BUN: 16 MG/DL (ref 7.0–18)
Bilirubin, total: 0.5 MG/DL (ref 0.2–1.0)
CO2: 29 mmol/L (ref 21–32)
Calcium: 9.2 MG/DL (ref 8.5–10.1)
Chloride: 102 mmol/L (ref 100–108)
Creatinine: 0.89 MG/DL (ref 0.6–1.3)
GFR est AA: >60 mL/min/{1.73_m2} (ref >60)
GFR est non-AA: >60 mL/min/{1.73_m2} (ref >60)
Globulin: 3.9 g/dL (ref 2.0–4.0)
Glucose: 99 mg/dL (ref 74–99)
Potassium: 4.4 mmol/L (ref 3.5–5.5)
Protein, total: 7.9 g/dL (ref 6.4–8.2)
Sodium: 138 mmol/L (ref 136–145)

## 2017-04-07 LAB — URINALYSIS W/ RFLX MICROSCOPIC
Bilirubin: NEGATIVE
Blood: NEGATIVE
Glucose: NEGATIVE mg/dL
Ketone: NEGATIVE mg/dL
Leukocyte Esterase: NEGATIVE
Nitrites: NEGATIVE
Protein: NEGATIVE mg/dL
Specific gravity: 1.006 (ref 1.005–1.030)
Urobilinogen: 0.2 EU/dL (ref 0.2–1.0)
pH (UA): 7 (ref 5.0–8.0)

## 2017-04-07 LAB — CBC WITH AUTOMATED DIFF
ABS. BASOPHILS: 0 10*3/uL (ref 0.0–0.06)
ABS. EOSINOPHILS: 0.1 10*3/uL (ref 0.0–0.4)
ABS. LYMPHOCYTES: 2.4 10*3/uL (ref 0.9–3.6)
ABS. MONOCYTES: 0.6 10*3/uL (ref 0.05–1.2)
ABS. NEUTROPHILS: 3.1 10*3/uL (ref 1.8–8.0)
BASOPHILS: 1 % (ref 0–2)
EOSINOPHILS: 1 % (ref 0–5)
HCT: 38.4 % (ref 35.0–45.0)
HGB: 12.6 g/dL (ref 12.0–16.0)
LYMPHOCYTES: 39 % (ref 21–52)
MCH: 31 PG (ref 24.0–34.0)
MCHC: 32.8 g/dL (ref 31.0–37.0)
MCV: 94.3 FL (ref 74.0–97.0)
MONOCYTES: 10 % (ref 3–10)
MPV: 9.8 FL (ref 9.2–11.8)
NEUTROPHILS: 49 % (ref 40–73)
PLATELET: 254 10*3/uL (ref 135–420)
RBC: 4.07 M/uL — ABNORMAL LOW (ref 4.20–5.30)
RDW: 13.9 % (ref 11.6–14.5)
WBC: 6.2 10*3/uL (ref 4.6–13.2)

## 2017-04-07 LAB — CARDIAC PANEL,(CK, CKMB & TROPONIN)
CK - MB: 1.5 ng/ml (ref <3.6)
CK-MB Index: 2.3 % (ref 0.0–4.0)
CK: 64 U/L (ref 26–192)
Troponin-I, QT: <0.02 NG/ML (ref 0.00–0.06)

## 2017-04-07 NOTE — ED Notes (Signed)
Patient resting supine in bed with continuous monitoring in place.  Side rails up with call bell in reach.  Patient advised of plan of care.   Patient resting with eyes closed and unlabored respirations.  Family at bedside resting also.  Awaiting results of chest xray.

## 2017-04-07 NOTE — ED Notes (Signed)
Care assumed for discharge purposes only. Discharge instructions reviewed with the patient with opportunity for questions given. The patient verbalized understanding. Patient armband removed and shredded. Patient in stable condition at time of discharge.

## 2017-04-07 NOTE — ED Notes (Signed)
Patient resting supine in bed with continuous monitoring in place.  Side rails up with call bell in reach.  Patient advised of plan of care.   Boyfriend at bedside.  Patient provided with water per request.

## 2017-04-07 NOTE — ED Provider Notes (Signed)
EMERGENCY DEPARTMENT HISTORY AND PHYSICAL EXAM    Date: 04/07/2017  Patient Name: Marissa Harrell    History of Presenting Illness     Chief Complaint   Patient presents with   ??? Altered mental status         History Provided By: Patient    Chief Complaint: Confusion  Duration: today   Timing:  Acute and resolved  Severity: Mild  Modifying Factors: Resolved without intervention  Associated Symptoms:  headache, trouble sleeping, dysuria, urinary frequency.    Additional History (Context):   3:41 PM  Marissa Harrell is a 83 y.o. female with PMHX of CAD who presents to the emergency department via EMS C/O resolved confusion, onset today. Associated sxs include resolved nausea while eating this morning, increased urinary frequency, dysuria, and HA. Pt states she often has HA and that her HA today is no different. Pt states she was backing out her car to go to a friend???s house, stopped to get something from her house, and when she got back into her car, she couldn???t remember how to drive her car. Pt notes that she did not sleep much last night, which is normal for her. Pt reports that she is not confused now. Pt denies PMHX CVA, recurrent UTIs, fever, abd pain, and one-sided weakness, urinary/bowel incontinence, and any other sxs or complaints.     PCP: Phys Other, MD    Current Outpatient Prescriptions   Medication Sig Dispense Refill   ??? HYDROcodone-acetaminophen (NORCO) 5-325 mg per tablet Take 1 Tab by mouth.     ??? hydroxychloroquine (PLAQUENIL) 200 mg tablet Take 200 mg by mouth daily.     ??? Azelastine (ASTEPRO) 0.15 % (205.5 mcg) nasal spray two (2) times a day.     ??? Biotin 2,500 mcg cap Take  by mouth.     ??? clonazePAM (KLONOPIN) 0.5 mg tablet Take  by mouth nightly as needed.     ??? docusate sodium (COLACE) 100 mg capsule Take 100 mg by mouth daily.     ??? estradiol (ESTRACE) 0.01 % (0.1 mg/gram) vaginal cream Insert 2 g into vagina daily.      ??? polyethylene glycol (MIRALAX) 17 gram packet Take 17 g by mouth daily.     ??? Omeprazole delayed release (PRILOSEC D/R) 20 mg tablet Take 20 mg by mouth daily.     ??? pilocarpine (SALAGEN) 5 mg tablet Take 7.5 mg by mouth three (3) times daily.     ??? predniSONE (DELTASONE) 5 mg tablet Take  by mouth.     ??? albuterol (PROAIR HFA) 90 mcg/actuation inhaler Take  by inhalation.     ??? conjugated estrogens (PREMARIN) 0.625 mg/gram vaginal cream Insert 0.5 g into vagina daily.     ??? bisacodyl (DULCOLAX, BISACODYL,) 10 mg suppository Insert 10 mg into rectum daily.     ??? MIRABEGRON (MYRBETRIQ PO) Take 50 mg by mouth.     ??? aspirin 81 mg chewable tablet Take 81 mg by mouth daily.     ??? nitroglycerin (NITROSTAT) 0.4 mg SL tablet by SubLINGual route every five (5) minutes as needed for Chest Pain.     ??? atorvastatin (LIPITOR) 40 mg tablet Take 20 mg by mouth daily.     ??? esomeprazole (NEXIUM) 40 mg capsule Take  by mouth daily.     ??? traZODone (DESYREL) 50 mg tablet Take  by mouth nightly.     ??? tolterodine (DETROL) 2 mg tablet Take 2 mg by mouth  two (2) times a day.     ??? acetaminophen (TYLENOL EXTRA STRENGTH) 500 mg tablet Take 500 mg by mouth every six (6) hours as needed for Pain.     ??? CALCIUM CARBONATE/VITAMIN D3 (CALCIUM 600 + D,3, PO) Take  by mouth daily.     ??? carvedilol (COREG CR) 20 mg CR capsule Take 20 mg by mouth daily (with breakfast).     ??? telmisartan (MICARDIS) 20 mg tablet Take 20 mg by mouth daily. Indications: HYPERTENSION     ??? cycloSPORINE (RESTASIS) 0.05 % ophthalmic emulsion Administer 1 Drop to both eyes two (2) times a day.         Past History     Past Medical History:  Past Medical History:   Diagnosis Date   ??? Arthritis    ??? CAD (coronary artery disease)     s/p stents, EF 33%   ??? Chronic kidney disease     renal artery stenosis   ??? Esophageal reflux 11/05/2012   ??? GERD (gastroesophageal reflux disease)     well controlled   ??? Heart attack ([place])    ??? Hypertension    ??? Nausea & vomiting     ??? OAB (overactive bladder) 08/10/2013    83 y.o. female who presents today for s/p bladder in the [employer] > 10 years ago Had problems with catheter . Now if under stress c/o OAB Tried Toviaz without any help Now on detrol LA Has some mild SUI , mostly OAB Had urge incontinence once . Marland Kitchen Has Sjogren's syndrome [710.2] and c/o dry mouth. Will try myrbetriq 25/50 mg Nocturia 3-4     ??? Psychiatric disorder     anxiety   ??? Sjogren's syndrome ([place])        Past Surgical History:  Past Surgical History:   Procedure Laterality Date   ??? CARDIAC SURG PROCEDURE UNLIST      stent x 3   ??? HX APPENDECTOMY     ??? HX BACK SURGERY     ??? HX CATARACT REMOVAL      bilateral   ??? HX CERVICAL FUSION     ??? HX ORTHOPAEDIC      [place]   ??? HX UROLOGICAL      bladder suspension       Family History:  History reviewed. No pertinent family history.    Social History:  Social History   Substance Use Topics   ??? Smoking status: Former Smoker   ??? Smokeless tobacco: Never Used   ??? Alcohol use Yes      Comment: 1 per week       Allergies:  Allergies   Allergen Reactions   ??? Codeine Other (comments)     Gets very hyper         Review of Systems   Review of Systems   Constitutional: Negative for fever.   HENT: Positive for congestion.    Gastrointestinal: Negative for abdominal pain and nausea.   Genitourinary: Positive for dysuria and frequency.   Neurological: Positive for headaches. Negative for weakness.        (-) urinary/bowel incontinence   Psychiatric/Behavioral: Positive for sleep disturbance.   All other systems reviewed and are negative.      Physical Exam     Vitals:    04/07/17 1543 04/07/17 1549 04/07/17 1630 04/07/17 1829   BP: (!) 193/93  178/88 143/82   Pulse: 72  68 77   Resp: 16  16 16  Temp:  97.8 ??F (36.6 ??C)     SpO2: 100%  100% 98%   Weight: 52.2 kg (115 lb) 43.1 kg (95 lb)     Height:  5' 1"  (1.549 m)       Physical Exam   Nursing note and vitals reviewed.    Constitutional: Alert. Well appearing, no acute distress   Head: Normocephalic, Atraumatic  Eyes: Pupils are equal, round, and reactive to light, EOMI  ENT: Moist mucous membranes, oropharynx clear.  Neck: Supple, non-tender  Cardiovascular: Regular rate and rhythm, no murmurs, rubs, or gallops  Chest: Normal work of breathing and chest excursion bilaterally.  No reproducible chest tenderness.  Lungs: Clear to ausculation bilaterally.  Abdomen: Soft, mild suprapubic tenderness. No guarding, no rebound, non distended, normoactive bowel sounds  Back: No evidence of trauma or deformity. No CVA Tenderness.  Extremities: No evidence of trauma or deformity, no LE edema  Skin: Warm and dry  Neuro: Alert and appropriate, facial movement symmetric, normal speech, strength and sensation full and symmetric bilaterally, normal gait, normal coordination  Psychiatric: Normal mood and affect      Diagnostic Study Results     Labs -     Recent Results (from the past 12 hour(s))   URINALYSIS W/ RFLX MICROSCOPIC    Collection Time: 04/07/17  3:30 PM   Result Value Ref Range    Color YELLOW      Appearance CLEAR      Specific gravity 1.006 1.005 - 1.030      pH (UA) 7.0 5.0 - 8.0      Protein NEGATIVE  NEG mg/dL    Glucose NEGATIVE  NEG mg/dL    Ketone NEGATIVE  NEG mg/dL    Bilirubin NEGATIVE  NEG      Blood NEGATIVE  NEG      Urobilinogen 0.2 0.2 - 1.0 EU/dL    Nitrites NEGATIVE  NEG      Leukocyte Esterase NEGATIVE  NEG     CBC WITH AUTOMATED DIFF    Collection Time: 04/07/17  4:00 PM   Result Value Ref Range    WBC 6.2 4.6 - 13.2 K/uL    RBC 4.07 (L) 4.20 - 5.30 M/uL    HGB 12.6 12.0 - 16.0 g/dL    HCT 38.4 35.0 - 45.0 %    MCV 94.3 74.0 - 97.0 FL    MCH 31.0 24.0 - 34.0 PG    MCHC 32.8 31.0 - 37.0 g/dL    RDW 13.9 11.6 - 14.5 %    PLATELET 254 135 - 420 K/uL    MPV 9.8 9.2 - 11.8 FL    NEUTROPHILS 49 40 - 73 %    LYMPHOCYTES 39 21 - 52 %    MONOCYTES 10 3 - 10 %    EOSINOPHILS 1 0 - 5 %    BASOPHILS 1 0 - 2 %    ABS. NEUTROPHILS 3.1 1.8 - 8.0 K/UL    ABS. LYMPHOCYTES 2.4 0.9 - 3.6 K/UL     ABS. MONOCYTES 0.6 0.05 - 1.2 K/UL    ABS. EOSINOPHILS 0.1 0.0 - 0.4 K/UL    ABS. BASOPHILS 0.0 0.0 - 0.06 K/UL    DF AUTOMATED     METABOLIC PANEL, COMPREHENSIVE    Collection Time: 04/07/17  4:00 PM   Result Value Ref Range    Sodium 138 136 - 145 mmol/L    Potassium 4.4 3.5 - 5.5 mmol/L    Chloride 102 100 -  108 mmol/L    CO2 29 21 - 32 mmol/L    Anion gap 7 3.0 - 18 mmol/L    Glucose 99 74 - 99 mg/dL    BUN 16 7.0 - 18 MG/DL    Creatinine 0.89 0.6 - 1.3 MG/DL    BUN/Creatinine ratio 18 12 - 20      GFR est AA >60 >60 ml/min/1.65m    GFR est non-AA >60 >60 ml/min/1.765m   Calcium 9.2 8.5 - 10.1 MG/DL    Bilirubin, total 0.5 0.2 - 1.0 MG/DL    ALT (SGPT) 21 13 - 56 U/L    AST (SGOT) 17 15 - 37 U/L    Alk. phosphatase 41 (L) 45 - 117 U/L    Protein, total 7.9 6.4 - 8.2 g/dL    Albumin 4.0 3.4 - 5.0 g/dL    Globulin 3.9 2.0 - 4.0 g/dL    A-G Ratio 1.0 0.8 - 1.7     CARDIAC PANEL,(CK, CKMB & TROPONIN)    Collection Time: 04/07/17  4:00 PM   Result Value Ref Range    CK 64 26 - 192 U/L    CK - MB 1.5 <3.6 ng/ml    CK-MB Index 2.3 0.0 - 4.0 %    Troponin-I, Qt. <0.02 0.00 - 0.06 NG/ML   EKG, 12 LEAD, INITIAL    Collection Time: 04/07/17  4:01 PM   Result Value Ref Range    Ventricular Rate 71 BPM    Atrial Rate 71 BPM    P-R Interval 182 ms    QRS Duration 150 ms    Q-T Interval 442 ms    QTC Calculation (Bezet) 480 ms    Calculated P Axis 69 degrees    Calculated R Axis -40 degrees    Calculated T Axis 110 degrees    Diagnosis       Normal sinus rhythm  Left axis deviation  Left bundle branch block  Abnormal ECG  When compared with ECG of 19-Oct-2016 17:09,  No significant change was found         Radiologic Studies -     6:02 PM  RADIOLOGY FINDINGS  Chest X-ray shows no infiltrate or pntx.   Pending review by Radiologist  Recorded by CiMidge AverED Scribe, as dictated by ReGardiner SleeperMD     CT HEAD WO CONT   Final Result      XR CHEST PA LAT    (Results Pending)     CT Results  (Last 48 hours)                04/07/17 1648  CT HEAD WO CONT Final result    Impression:  IMPRESSION:       No acute intracranial abnormalities.       Nonspecific white matter changes, likely sequela of chronic small vessel   ischemia       Narrative:  EXAM: CT head       INDICATION: Headache.       COMPARISON: October 19, 2016       TECHNIQUE: Axial CT imaging of the head was performed without intravenous   contrast.       Dose reduction techniques used: Automated exposure control, adjustment of the   mAs and/or kVp according to patient's size, and iterative reconstruction   techniques. The specific techniques utilized on this CT exam have been   documented in the patient's electronic medical record.  _______________       FINDINGS:       BRAIN AND POSTERIOR FOSSA: The sulci, folia, ventricles and basal cisterns are   within normal limits for the patient?s age. There is no intracranial hemorrhage,   mass effect, or midline shift. There are areas of decreased attenuation in the   periventricular and subcortical white matter.       EXTRA-AXIAL SPACES AND MENINGES: There are no abnormal extra-axial fluid   collections.       CALVARIUM: Intact.       SINUSES: Clear.       OTHER: None.       _______________               CXR Results  (Last 48 hours)    None          Medications given in the ED-  Medications - No data to display      Medical Decision Making   I am the first provider for this patient.    I reviewed the vital signs, available nursing notes, past medical history, past surgical history, family history and social history.    Vital Signs-Reviewed the patient's vital signs.    Pulse Oximetry Analysis - 100% on RA     Cardiac Monitor:  Rate: 73 bpm  Rhythm: NSR    EKG interpretation: (Preliminary)  4:01 PM   NSR at 71 bpm with a LBBB. No ST segment change. Unchanged from 03/05/16.   EKG read by Gardiner Sleeper, MD at 4:01 PM     Records Reviewed: Nursing Notes    Procedures:  Procedures    ED Course:    3:41 PM Initial assessment performed. The patients presenting problems have been discussed, and they are in agreement with the care plan formulated and outlined with them.  I have encouraged them to ask questions as they arise throughout their visit.    6:08 PM Boyfriend is at bedside and states that pt is seeing a neurologist tomorrow because she had been very forgetful lately. When she mentioned her memory loss today she meant memory loss specific to driving. We offered overnight observation but patient respectfully declined and would like to just see neurologist tomorrow.    Diagnosis and Disposition       DISCHARGE NOTE:  6:08 PM    Marissa Harrell's  results have been reviewed with her.  She has been counseled regarding her diagnosis, treatment, and plan.  She verbally conveys understanding and agreement of the signs, symptoms, diagnosis, treatment and prognosis and additionally agrees to follow up as discussed.  She also agrees with the care-plan and conveys that all of her questions have been answered.  I have also provided discharge instructions for her that include: educational information regarding their diagnosis and treatment, and list of reasons why they would want to return to the ED prior to their follow-up appointment, should her condition change. She has been provided with education for proper emergency department utilization.     Discussion: 83 y/o female presenting with an episode of confusion. She has no evidence of infectious process. EKG is nonischemic with no episodes of arrhythmia. CT Head is negative. CXR is clear. Labs are otherwise reassuring for NAP. Discussed with her boyfriend who arrived later who states she???s been having some increased memory difficulties and this is likely the cause of her sxs today. She has neurology follow up tomorrow. Return precautions provided.     CLINICAL IMPRESSION:  1. Altered mental status, unspecified altered mental status type    2. Memory changes         PLAN:  1. D/C Home  2.   Current Discharge Medication List        3.   Follow-up Information     Follow up With Details Comments Contact Info    Your neurologist Go in 1 day For your scheduled appointment     [facility] EMERGENCY DEPT  As needed, if symptoms worsen [address]  [place] [address]  [phone]        _______________________________    Attestations:  This note is prepared by Florencia Reasons and Cinyu Chi, acting as Scribe for Gardiner Sleeper, MD.    Gardiner Sleeper, MD:  The scribe's documentation has been prepared under my direction and personally reviewed by me in its entirety.  I confirm that the note above accurately reflects all work, treatment, procedures, and medical decision making performed by me.  _______________________________

## 2017-04-07 NOTE — ED Triage Notes (Signed)
Presents from [place] after someone saw patient in car trying to start the radio with her keys.  Patient states she gets confused sometimes, denies confusion on arrival,  States headache and no sleep last night

## 2017-04-08 LAB — EKG 12-LEAD
Atrial Rate: 71 {beats}/min
Diagnosis: NORMAL
P Axis: 69 degrees
P-R Interval: 182 ms
Q-T Interval: 442 ms
QRS Duration: 150 ms
QTc Calculation (Bazett): 480 ms
R Axis: -40 degrees
T Axis: 110 degrees
Ventricular Rate: 71 {beats}/min

## 2017-04-08 LAB — EKG, 12 LEAD, INITIAL
Atrial Rate: 71 {beats}/min
Calculated P Axis: 69 degrees
Calculated R Axis: -40 degrees
Calculated T Axis: 110 degrees
Diagnosis: NORMAL
P-R Interval: 182 ms
Q-T Interval: 442 ms
QRS Duration: 150 ms
QTC Calculation (Bezet): 480 ms
Ventricular Rate: 71 {beats}/min

## 2017-10-11 ENCOUNTER — Encounter: Payer: Self-pay | Admitting: Neurology

## 2017-11-05 ENCOUNTER — Ambulatory Visit
Admission: RE | Admit: 2017-11-05 | Discharge: 2017-11-05 | Disposition: A | Discharge status: Home / Self Care | Payer: Medicare Other | Source: Ambulatory Visit | Attending: Internal Medicine | Admitting: Internal Medicine

## 2017-11-05 ENCOUNTER — Other Ambulatory Visit: Payer: Self-pay | Admitting: Internal Medicine

## 2017-11-05 DIAGNOSIS — R0781 Pleurodynia: Secondary | ICD-10-CM

## 2017-11-29 ENCOUNTER — Ambulatory Visit: Payer: Medicare Other | Attending: Orthopedic Surgery | Admitting: Physical Therapy

## 2017-11-29 ENCOUNTER — Encounter: Payer: Self-pay | Admitting: Physical Therapy

## 2017-11-29 DIAGNOSIS — R296 Repeated falls: Secondary | ICD-10-CM | POA: Insufficient documentation

## 2017-11-29 DIAGNOSIS — M6281 Muscle weakness (generalized): Secondary | ICD-10-CM

## 2017-11-29 NOTE — Therapy (Signed)
[facility] [place] [address] [place], [place], [address] Phone: [phone]   Fax:  [phone]  Physical Therapy Evaluation  Patient Details  Name: Leslie Park MRN: [id number] Date of Birth: [birth year] Referring Provider: Constance GoltzSchoenhoff   Encounter Date: 11/29/2017  PT End of Session - 11/29/17 1143    Visit Number  1    Date for PT Re-Evaluation  01/30/17    PT Start Time  1100    PT Stop Time  1138    PT Time Calculation (min)  38 min    Activity Tolerance  Patient tolerated treatment well    Behavior During Therapy  [facility] for tasks assessed/performed       History reviewed. No pertinent past medical history.  History reviewed. No pertinent surgical history.  There were no vitals filed for this visit.   Subjective Assessment - 11/29/17 1103    Subjective  Patient reports 3 falls in the past 6 months.  She reports that she usually is walking and trips and falls forward.  X-rays have been negative.  Her most recent fall was in November and she reports left knee pain    Patient Stated Goals  have no falls    Currently in Pain?  Yes    Pain Score  1     Pain Location  Knee    Pain Orientation  Left    Pain Descriptors / Indicators  Sore    Pain Type  Acute pain    Pain Onset  More than a month ago    Pain Frequency  Intermittent    Aggravating Factors   getting up nad down from sitting    Pain Relieving Factors  rest    Effect of Pain on Daily Activities  just hurts a little         [facility] PT Assessment - 11/29/17 0001      Assessment   Medical Diagnosis  repeated falls    Referring Provider  Schoenhoff    Onset Date/Surgical Date  10/30/17    Prior Therapy  none      Precautions   Precautions  None      Balance Screen   Has the patient fallen in the past 6 months  Yes    How many times?  3    Has the patient had a decrease in activity level because of a fear of falling?   No    Is the patient  reluctant to leave their home because of a fear of falling?   No      Home Environment   Additional Comments  lives with daughter, 4 steps into the home, light housework      Prior Function   Level of Independence  Independent    Vocation  Retired    Leisure  does some walking 2x/week      ROM / Strength   AROM / PROM / Strength  AROM;Strength      AROM   Overall AROM Comments  WFL's      Strength   Overall Strength Comments  knees 3+/5, hips 4-/5, ankle 4/5      Palpation   Palpation comment  non tender in the left knee, some crepitus      Ambulation/Gait   Gait Comments  no device, tends to shuffle feet, left heel is the side that tends to catch the most, has flexion in the knees, hangs on hip flexors some.  Standardized Balance Assessment   Standardized Balance Assessment  Berg Balance Test;Timed Up and Go Test      Berg Balance Test   Sit to Stand  Able to stand without using hands and stabilize independently    Standing Unsupported  Able to stand safely 2 minutes    Sitting with Back Unsupported but Feet Supported on Floor or Stool  Able to sit safely and securely 2 minutes    Stand to Sit  Sits safely with minimal use of hands    Transfers  Able to transfer safely, minor use of hands    Standing Unsupported with Eyes Closed  Able to stand 10 seconds with supervision    Standing Ubsupported with Feet Together  Able to place feet together independently and stand 1 minute safely    From Standing, Reach Forward with Outstretched Arm  Can reach confidently >25 cm (10")    From Standing Position, Pick up Object from Floor  Able to pick up shoe safely and easily    From Standing Position, Turn to Look Behind Over each Shoulder  Looks behind from both sides and weight shifts well    Turn 360 Degrees  Able to turn 360 degrees safely one side only in 4 seconds or less    Standing Unsupported, Alternately Place Feet on Step/Stool  Able to stand independently and complete 8  steps >20 seconds    Standing Unsupported, One Foot in Front  Needs help to step but can hold 15 seconds    Standing on One Leg  Tries to lift leg/unable to hold 3 seconds but remains standing independently    Total Score  47      Timed Up and Go Test   Normal TUG (seconds)  15             Objective measurements completed on examination: See above findings.              PT Education - 11/29/17 1142    Education provided  Yes    Education Details  Started standing hip kicks and marches while lightly holding wall or table, marches and side steps    Person(s) Educated  Patient;Child(ren)    Methods  Explanation;Demonstration    Comprehension  Verbalized understanding       PT Short Term Goals - 11/29/17 1147      PT SHORT TERM GOAL #1   Title  independent with initial HEP    Time  2    Period  Weeks    Status  New        PT Long Term Goals - 11/29/17 1147      PT LONG TERM GOAL #1   Title  decrease TUG time to 12 seconds    Time  8    Period  Weeks    Status  New      PT LONG TERM GOAL #2   Title  increase Berg balance score to 50/56    Time  8    Period  Weeks    Status  New      PT LONG TERM GOAL #3   Title  walk 300 feet without catching left heel on the ground    Time  8    Period  Weeks    Status  New      PT LONG TERM GOAL #4   Title  increase strength of the LE's to 4-/5    Time  8  Period  Weeks    Status  New             Plan - December 14, 2017 1143    Clinical Impression Statement  Patient has had 3 falls over the past 6 months, most recent one being 1 month ago.  She has weakness in the hips and knees, tends to shuffle feet and catches the left heel often.  Berg balance 47/56, TUG 15 seconds    Clinical Presentation  Stable    Clinical Decision Making  Low    Rehab Potential  Good    PT Frequency  2x / week    PT Duration  8 weeks    PT Treatment/Interventions  Gait training;Stair training;Functional mobility  training;Therapeutic activities;Therapeutic exercise;Balance training;Neuromuscular re-education;Patient/family education    PT Next Visit Plan  slowly add gym exercises for LE strength and work on balance    Consulted and Agree with Plan of Care  Patient       Patient will benefit from skilled therapeutic intervention in order to improve the following deficits and impairments:  Abnormal gait, Difficulty walking, Decreased balance, Decreased strength, Decreased mobility  Visit Diagnosis: Repeated falls - Plan: PT plan of care cert/re-cert  Muscle weakness (generalized) - Plan: PT plan of care cert/re-cert  G-Codes - December 14, 2017 1149    Functional Assessment Tool Used (Outpatient Only)  Berg     Functional Limitation  Mobility: Walking and moving around    Mobility: Walking and Moving Around Current Status [phone])  At least 20 percent but less than 40 percent impaired, limited or restricted    Mobility: Walking and Moving Around Goal Status (U0454)  At least 1 percent but less than 20 percent impaired, limited or restricted        Problem List There are no active problems to display for this patient.   Jearld Lesch., PT 12/14/2017, 11:55 AM  [facility] [address] [place] [address] [place], [place], [address] Phone: [phone]   Fax:  [phone]  Name: Leslie Park MRN: [id number] Date of Birth: 1928/04/20

## 2017-12-04 ENCOUNTER — Ambulatory Visit: Payer: Medicare Other | Admitting: Physical Therapy

## 2017-12-06 ENCOUNTER — Ambulatory Visit: Payer: Medicare Other | Admitting: Physical Therapy

## 2017-12-06 DIAGNOSIS — M6281 Muscle weakness (generalized): Secondary | ICD-10-CM

## 2017-12-06 DIAGNOSIS — R296 Repeated falls: Secondary | ICD-10-CM | POA: Diagnosis not present

## 2017-12-06 NOTE — Therapy (Signed)
[address] [place] [place], [place], [address] Phone: [phone]   Fax:  [phone]  Physical Therapy Treatment  Patient Details  Name: Leslie Park MRN: [id number] Date of Birth: July 30, 1928 Referring Provider: Coralyn Mark   Encounter Date: 12/06/2017  PT End of Session - 12/06/17 1002    Visit Number  2    Date for PT Re-Evaluation  01/30/17    PT Start Time  0935    PT Stop Time  1005    PT Time Calculation (min)  30 min       No past medical history on file.  No past surgical history on file.  There were no vitals filed for this visit.  Subjective Assessment - 12/06/17 0936    Subjective  walking and doing some kicks ( referring to HEP)    Currently in Pain?  No/denies                      [facility] Adult PT Treatment/Exercise - 12/06/17 0001      High Level Balance   High Level Balance Activities  Negotitating around obstacles;Negotiating over obstacles ball toss airex      Exercises   Exercises  Knee/Hip      Knee/Hip Exercises: Aerobic   Nustep  L 4 5 min      Knee/Hip Exercises: Standing   Walking with Sports Cord  20# 3 times each way mod A very difficult    Other Standing Knee Exercises  yellow tband hip 3 way 12 reps each      Knee/Hip Exercises: Seated   Long Arc Quad  Both;10 reps yellow tband    Ball Squeeze  15    Clamshell with TheraBand  Yellow    Other Seated Knee/Hip Exercises  hip flex yellow tband 10 each    Hamstring Curl  Both;10 reps yellow tband               PT Short Term Goals - 12/06/17 1003      PT SHORT TERM GOAL #1   Title  independent with initial HEP    Status  Partially Met        PT Long Term Goals - 11/29/17 1147      PT LONG TERM GOAL #1   Title  decrease TUG time to 12 seconds    Time  8    Period  Weeks    Status  New      PT LONG TERM GOAL #2   Title  increase Berg balance score to 50/56    Time  8    Period   Weeks    Status  New      PT LONG TERM GOAL #3   Title  walk 300 feet without catching left heel on the ground    Time  8    Period  Weeks    Status  New      PT LONG TERM GOAL #4   Title  increase strength of the LE's to 4-/5    Time  8    Period  Weeks    Status  New            Plan - 12/06/17 1002    Clinical Impression Statement  pt tolerated ther ex well, difficulty with resisted gait and ball toss on airex when going left.    PT Treatment/Interventions  Gait training;Stair training;Functional mobility training;Therapeutic activities;Therapeutic exercise;Balance  training;Neuromuscular re-education;Patient/family education    PT Next Visit Plan  slowly add gym exercises for LE strength and work on balance       Patient will benefit from skilled therapeutic intervention in order to improve the following deficits and impairments:  Abnormal gait, Difficulty walking, Decreased balance, Decreased strength, Decreased mobility  Visit Diagnosis: Repeated falls  Muscle weakness (generalized)     Problem List There are no active problems to display for this patient.   PAYSEUR,ANGIE PTA 12/06/2017, 10:03 AM  [place], [place], [address] Phone: [phone]   Fax:  [phone]  Name: Leslie Park MRN: [id number] Date of Birth: [birth year]

## 2017-12-11 ENCOUNTER — Ambulatory Visit: Payer: Medicare Other | Admitting: Physical Therapy

## 2017-12-18 ENCOUNTER — Encounter: Payer: Self-pay | Admitting: Physical Therapy

## 2017-12-18 ENCOUNTER — Ambulatory Visit: Payer: Medicare Other | Attending: Orthopedic Surgery | Admitting: Physical Therapy

## 2017-12-18 DIAGNOSIS — R296 Repeated falls: Secondary | ICD-10-CM | POA: Diagnosis present

## 2017-12-18 DIAGNOSIS — M6281 Muscle weakness (generalized): Secondary | ICD-10-CM | POA: Insufficient documentation

## 2017-12-18 NOTE — Therapy (Addendum)
[place] [address] [place], [place], [address] Phone: [phone]   Fax:  [phone]  Physical Therapy Treatment  Patient Details  Name: Leslie Park MRN: [id number] Date of Birth: 04/16/1928 Referring Provider: Coralyn Mark   Encounter Date: 12/18/2017  PT End of Session - 12/18/17 1227    Visit Number  3    Date for PT Re-Evaluation  01/30/17    PT Start Time  1829    PT Stop Time  1227    PT Time Calculation (min)  42 min    Activity Tolerance  Patient tolerated treatment well    Behavior During Therapy  [facility] for tasks assessed/performed       History reviewed. No pertinent past medical history.  History reviewed. No pertinent surgical history.  There were no vitals filed for this visit.  Subjective Assessment - 12/18/17 1150    Subjective  "Im doing pretty good"     Currently in Pain?  No/denies    Pain Score  0-No pain                      [facility] Adult PT Treatment/Exercise - 12/18/17 0001      High Level Balance   High Level Balance Activities  Negotiating over obstacles    High Level Balance Comments  alt step over foam roll, side step over foiam roll. Airex standing and march       Knee/Hip Exercises: Aerobic   Nustep  L 4 6 min      Knee/Hip Exercises: Standing   Other Standing Knee Exercises  alt box taps       Knee/Hip Exercises: Seated   Long Arc Quad  Both;10 reps;2 sets;Weights    Long Arc Quad Weight  2 lbs.    Ball Squeeze  2x10    Clamshell with TheraBand  Yellow    Hamstring Curl  Both;2 sets;10 reps    Hamstring Limitations  yellow Tband     Sit to Sand  2 sets;10 reps;without UE support from UBE seat                PT Short Term Goals - 12/06/17 1003      PT SHORT TERM GOAL #1   Title  independent with initial HEP    Status  Partially Met        PT Long Term Goals - 11/29/17 1147      PT LONG TERM GOAL #1   Title  decrease TUG time to 12  seconds    Time  8    Period  Weeks    Status  New      PT LONG TERM GOAL #2   Title  increase Berg balance score to 50/56    Time  8    Period  Weeks    Status  New      PT LONG TERM GOAL #3   Title  walk 300 feet without catching left heel on the ground    Time  8    Period  Weeks    Status  New      PT LONG TERM GOAL #4   Title  increase strength of the LE's to 4-/5    Time  8    Period  Weeks    Status  New            Plan - 12/18/17 1228    Clinical Impression Statement  Pt tolerated today's exercises well. Some difficulty with side stepping over foam roll.     Rehab Potential  Good    PT Frequency  2x / week    PT Duration  8 weeks    PT Treatment/Interventions  Gait training;Stair training;Functional mobility training;Therapeutic activities;Therapeutic exercise;Balance training;Neuromuscular re-education;Patient/family education    PT Next Visit Plan  slowly add gym exercises for LE strength and work on balance       Patient will benefit from skilled therapeutic intervention in order to improve the following deficits and impairments:  Abnormal gait, Difficulty walking, Decreased balance, Decreased strength, Decreased mobility  Visit Diagnosis: Muscle weakness (generalized)  Repeated falls     Problem List There are no active problems to display for this patient.  PHYSICAL THERAPY DISCHARGE SUMMARY  Visits from Start of Care: 3 Plan: Patient agrees to discharge.  Patient goals were not met. Patient is being discharged due to being pleased with the current functional level.  ?????     Scot Jun, PTA 12/18/2017, 12:29 PM  [address] [place] [address] [place], [place], [address] Phone: [phone]   Fax:  [phone]  Name: Leslie Park MRN: [id number] Date of Birth: [birth year]

## 2017-12-30 ENCOUNTER — Ambulatory Visit: Payer: Self-pay | Admitting: Neurology

## 2018-02-10 ENCOUNTER — Ambulatory Visit: Payer: Medicare Other | Admitting: Neurology

## 2018-07-14 ENCOUNTER — Encounter: Payer: Self-pay | Admitting: Neurology

## 2018-09-05 ENCOUNTER — Encounter: Payer: Self-pay | Admitting: Neurology

## 2018-09-05 ENCOUNTER — Ambulatory Visit (INDEPENDENT_AMBULATORY_CARE_PROVIDER_SITE_OTHER): Payer: Medicare Other | Admitting: Neurology

## 2018-09-05 ENCOUNTER — Other Ambulatory Visit: Payer: Self-pay

## 2018-09-05 VITALS — BP 132/74 | HR 68 | Ht 61.0 in | Wt 93.0 lb

## 2018-09-05 DIAGNOSIS — F03A0 Unspecified dementia, mild, without behavioral disturbance, psychotic disturbance, mood disturbance, and anxiety: Secondary | ICD-10-CM

## 2018-09-05 DIAGNOSIS — F0390 Unspecified dementia without behavioral disturbance: Secondary | ICD-10-CM

## 2018-09-05 DIAGNOSIS — R413 Other amnesia: Secondary | ICD-10-CM | POA: Diagnosis not present

## 2018-09-05 MED ORDER — RIVASTIGMINE TARTRATE 1.5 MG PO CAPS: 1.5000 mg | ORAL_CAPSULE | Freq: Two times a day (BID) | ORAL | 11 refills | Status: AC

## 2018-09-05 NOTE — Patient Instructions (Addendum)
1. Schedule head CT without contrast  We have sent a referral to [facility] for your CT and they will call you directly to schedule your appt. They are located at [address] If you need to contact them directly please call [phone].   2. Start Rivastigmine 1.5mg  twice a day. Call our office in 2 months for an update, if no side effects, we will increase dose 3. Continue day program, and look into [employer] exercise at your senior center 4. Follow-up in 6 months or so, call for any changes  FALL PRECAUTIONS: Be cautious when walking. Scan the area for obstacles that may increase the risk of trips and falls. When getting up in the mornings, sit up at the edge of the bed for a few minutes before getting out of bed. Consider elevating the bed at the head end to avoid drop of blood pressure when getting up. Walk always in a well-lit room (use night lights in the walls). Avoid area rugs or power cords from appliances in the middle of the walkways. Use a walker or a cane if necessary and consider physical therapy for balance exercise. Get your eyesight checked regularly.  FINANCIAL OVERSIGHT: Supervision, especially oversight when making financial decisions or transactions is also recommended.  HOME SAFETY: Consider the safety of the kitchen when operating appliances like stoves, microwave oven, and blender. Consider having supervision and share cooking responsibilities until no longer able to participate in those. Accidents with firearms and other hazards in the house should be identified and addressed as well.  ABILITY TO BE LEFT ALONE: If patient is unable to contact 911 operator, consider using LifeLine, or when the need is there, arrange for someone to stay with patients. Smoking is a fire hazard, consider supervision or cessation. Risk of wandering should be assessed by caregiver and if detected at any point, supervision and safe proof recommendations should be  instituted.  MEDICATION SUPERVISION: Inability to self-administer medication needs to be constantly addressed. Implement a mechanism to ensure safe administration of the medications.  RECOMMENDATIONS FOR ALL PATIENTS WITH MEMORY PROBLEMS: 1. Continue to exercise (Recommend 30 minutes of walking everyday, or 3 hours every week) 2. Increase social interactions - continue going to [place] and enjoy social gatherings with friends and family 3. Eat healthy, avoid fried foods and eat more fruits and vegetables 4. Maintain adequate blood pressure, blood sugar, and blood cholesterol level. Reducing the risk of stroke and cardiovascular disease also helps promoting better memory. 5. Avoid stressful situations. Live a simple life and avoid aggravations. Organize your time and prepare for the next day in anticipation. 6. Sleep well, avoid any interruptions of sleep and avoid any distractions in the bedroom that may interfere with adequate sleep quality 7. Avoid sugar, avoid sweets as there is a strong link between excessive sugar intake, diabetes, and cognitive impairment The Mediterranean diet has been shown to help patients reduce the risk of progressive memory disorders and reduces cardiovascular risk. This includes eating fish, eat fruits and green leafy vegetables, nuts like almonds and hazelnuts, walnuts, and also use olive oil. Avoid fast foods and fried foods as much as possible. Avoid sweets and sugar as sugar use has been linked to worsening of memory function.  There is always a concern of gradual progression of memory problems. If this is the case, then we may need to adjust level of care according to patient needs. Support, both to the patient and caregiver, should then be put into place.

## 2018-09-05 NOTE — Progress Notes (Signed)
NEUROLOGY CONSULTATION NOTE  Leslie Park MRN: [id number] DOB: 1928/07/01  Referring provider: Dr. Raechel Chute Primary care provider: Dr. Raechel Chute  Reason for consult:  Memory loss  Dear Dr Constance Goltz:  Thank you for your kind referral of Leslie Park for consultation of the above symptoms. Although her history is well known to you, please allow me to reiterate it for the purpose of our medical record. The patient was accompanied to the clinic by her daughter Leslie Park who also provides collateral information. Records and images were personally reviewed where available.  HISTORY OF PRESENT ILLNESS: This is a pleasant 84 year old right-handed woman with a history of hypertension, hyperlipidemia, osteoporosis, renal artery stenosis, presenting for evaluation of worsening memory. She feels her memory is still pretty good, "but I do forget things." Her daughter started noticing memory changes around 4 years ago after she had 2 surgeries within 6 months of each other. After this, she noticed a real change in the patient's memory. She moved to independent living 4 years ago because she got tired of taking care of her home. Around 3 years ago, a nurse started coming to help her with medications. Leslie Park picked her up 16 months ago to take care of her. She stopped driving at that time. She denies getting lost driving, but [place] reports that [place] staff told her that she went to the office asking how to start her car. She had a neighbor helping her with finances around 2 years ago, her daughter took over a year ago. Since moving in with her daughter, her daughter reports she is very forgetful and repeats herself constantly. She thought her phone was the remote control. Leslie Park finds things in odd places. She is not allowed to cook since she had a fall a year ago when she was running trying to catch a burned pan. She had seen neurologist Dr. Paulina Fusi in [place] and was started on Donepezil, but got  very sick on it.  She has occasional bad headaches. Her daughter states she has never complained about this to her. She denies dizziness but Leslie Park reminds her she gets dizzy sometimes. She has occasional neck pain with the headaches. She denies any diplopia, dysarthria/dysphagia, neck/back pain, focal numbness/tingling/weakness, bowel/bladder dysfunction, anosmia, or tremors. She is on Myrbetriq for her bladder but denies incontinence. Leslie Park denies any paranoia or hallucinations, she is a little more irritable. She is on 1/2-1/4 tablet of Trazodone as needed for sleep. She states she does not sleep well, but her daughter thinks she sleeps fine. No family history of dementia. She denies any history of significant head injuries or alcohol use. She had a fall last month. She goes to [facility] day care three days a week.    PAST MEDICAL HISTORY: Past Medical History:  Diagnosis Date  . Hyperlipidemia   . Hypertension     PAST SURGICAL HISTORY: History reviewed. No pertinent surgical history.  MEDICATIONS:  Outpatient Encounter Medications as of 09/05/2018  Medication Sig  . aspirin EC 81 MG tablet aspirin 81 mg tablet,delayed release  Take 1 tablet every day by oral route.  Marland Kitchen atorvastatin (LIPITOR) 20 MG tablet Take by mouth.  . Biotin 1000 MCG tablet biotin  . Calcium Carb-Cholecalciferol 500-400 MG-UNIT CHEW Calcium 500 + D  . carvedilol (COREG) 25 MG tablet Take by mouth.  . clonazePAM (KLONOPIN) 0.5 MG tablet clonazepam 0.5 mg tablet  . docusate sodium (COLACE) 100 MG capsule Doc-Q-Lace 100 mg capsule  . mirabegron ER (MYRBETRIQ) 50 MG  TB24 tablet Take by mouth.  . nitroGLYCERIN (NITROSTAT) 0.4 MG SL tablet Place under the tongue.  . pilocarpine (SALAGEN) 5 MG tablet Take by mouth.  . telmisartan (MICARDIS) 20 MG tablet Take by mouth.  . traZODone (DESYREL) 100 MG tablet Take by mouth.   No facility-administered encounter medications on file as of 09/05/2018.     ALLERGIES: No  Known Allergies  FAMILY HISTORY: No family history on file.  SOCIAL HISTORY: Social History   Socioeconomic History  . Marital status: Significant Other    Spouse name: Not on file  . Number of children: Not on file  . Years of education: Not on file  . Highest education level: Not on file  Occupational History  . Not on file  Social Needs  . Financial resource strain: Not on file  . Food insecurity:    Worry: Not on file    Inability: Not on file  . Transportation needs:    Medical: Not on file    Non-medical: Not on file  Tobacco Use  . Smoking status: Not on file  Substance and Sexual Activity  . Alcohol use: Not on file  . Drug use: Not on file  . Sexual activity: Not on file  Lifestyle  . Physical activity:    Days per week: Not on file    Minutes per session: Not on file  . Stress: Not on file  Relationships  . Social connections:    Talks on phone: Not on file    Gets together: Not on file    Attends religious service: Not on file    Active member of club or organization: Not on file    Attends meetings of clubs or organizations: Not on file    Relationship status: Not on file  . Intimate partner violence:    Fear of current or ex partner: Not on file    Emotionally abused: Not on file    Physically abused: Not on file    Forced sexual activity: Not on file  Other Topics Concern  . Not on file  Social History Narrative  . Not on file    REVIEW OF SYSTEMS: Constitutional: No fevers, chills, or sweats, no generalized fatigue, change in appetite Eyes: No visual changes, double vision, eye pain Ear, nose and throat: No hearing loss, ear pain, nasal congestion, sore throat Cardiovascular: No chest pain, palpitations Respiratory:  No shortness of breath at rest or with exertion, wheezes GastrointestinaI: No nausea, vomiting, diarrhea, abdominal pain, fecal incontinence Genitourinary:  No dysuria, urinary retention or frequency Musculoskeletal:  No neck  pain, back pain Integumentary: No rash, pruritus, skin lesions Neurological: as above Psychiatric: No depression, insomnia, anxiety Endocrine: No palpitations, fatigue, diaphoresis, mood swings, change in appetite, change in weight, increased thirst Hematologic/Lymphatic:  No anemia, purpura, petechiae. Allergic/Immunologic: no itchy/runny eyes, nasal congestion, recent allergic reactions, rashes  PHYSICAL EXAM: Vitals:   09/05/18 1037  BP: 132/74  Pulse: 68  SpO2: 98%   General: No acute distress Head:  Normocephalic/atraumatic Eyes: Fundoscopic exam shows bilateral sharp discs, no vessel changes, exudates, or hemorrhages Neck: supple, no paraspinal tenderness, full range of motion Back: No paraspinal tenderness Heart: regular rate and rhythm Lungs: Clear to auscultation bilaterally. Vascular: No carotid bruits. Skin/Extremities: No rash, no edema Neurological Exam: Mental status: alert and oriented to person, knows she is in a medical facility, does not know city "where Leslie Park lives," knows state and season. No dysarthria or aphasia, Fund of knowledge is reduced.  Recent and remote memory are impaired.  Attention and concentration are normal.    Able to name objects and repeat phrases. CDT 4/5 MMSE - Mini Mental State Exam 09/05/2018  Orientation to time 1  Orientation to Place 3  Registration 3  Attention/ Calculation 4  Recall 0  Language- name 2 objects 2  Language- repeat 1  Language- follow 3 step command 0  Language- read & follow direction 1  Write a sentence 1  Copy design 1  Total score 17   Cranial nerves: CN I: not tested CN II: pupils equal, round and reactive to light, visual fields intact, fundi unremarkable. CN III, IV, VI:  full range of motion, no nystagmus, no ptosis CN V: facial sensation intact CN VII: upper and lower face symmetric CN VIII: hearing intact to finger rub CN IX, X: gag intact, uvula midline CN XI: sternocleidomastoid and trapezius  muscles intact CN XII: tongue midline Bulk & Tone: normal, no fasciculations. Motor: 5/5 throughout with no pronator drift. Sensation: intact to light touch, cold, pin on both UE, intact cold, pin on both LE, decreased vibration to ankles bilaterally. No extinction to double simultaneous stimulation.  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 Head CT without contrast will be ordered to assess for underlying structural abnormality. She had side effects on Donepezil and is agreeable to starting rivastigmine 1.5mg  BID. If no side effects, we will plan to increase to 3mg  BID in 2 months or so. We discussed mood, continue to monitor. We also discussed monitoring falls, if increasing frequency, would do Balance Therapy. We discussed the importance of control of vascular risk factors, physical exercise, and brain stimulation exercises for brain health. Continue day program, she was encouraged to look into Silver Sneakers and exercise groups at their local senior center. She does not drive. Follow-up in 6 months, they know to call for any changes.   Thank you for allowing me to participate in the care of this patient. Please do not hesitate to call for any questions or concerns.   Patrcia Dolly, M.D.  CC: Dr. Constance Goltz

## 2018-10-20 ENCOUNTER — Encounter: Payer: Self-pay | Admitting: Radiology

## 2018-10-20 ENCOUNTER — Ambulatory Visit
Admission: RE | Admit: 2018-10-20 | Discharge: 2018-10-20 | Disposition: A | Discharge status: Home / Self Care | Payer: Medicare Other | Source: Ambulatory Visit | Attending: Neurology | Admitting: Neurology

## 2018-10-20 DIAGNOSIS — R413 Other amnesia: Secondary | ICD-10-CM

## 2018-10-23 ENCOUNTER — Telehealth: Payer: Self-pay

## 2018-10-23 NOTE — Telephone Encounter (Signed)
-----   Message from Van Clines, MD sent at 10/21/2018  8:36 AM EST ----- Pls let daughter know the brain scan did not show any evidence of tumor,stroke, or bleed. It showed age-related changes. Thanks

## 2018-10-23 NOTE — Telephone Encounter (Signed)
Called pt's daughter.  It sounded like the phone was answered, however no one was there.  Unable to relay message below.

## 2018-11-18 IMAGING — CT CT HEAD W/O CM
3 of 4 series · 16 of 47 positions shown, 19 images · non-contrast
Comparison: None.

CLINICAL DATA: Memory loss. Moderate dementia. Headaches and
dizziness.

EXAM:
CT HEAD WITHOUT CONTRAST
TECHNIQUE: Contiguous axial images were obtained from the base of the skull
through the vertex without intravenous contrast.

[Series 2: head 5.00 hr40 s3 ibhc · axial · 0.43mm/px · z∈[-597,-462]mm · 10 of 33 slices shown, 13 images]
[im 3/33  brain]
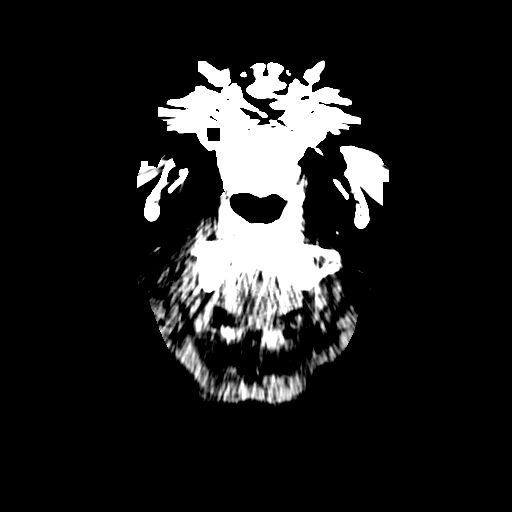
[im 3/33  bone]
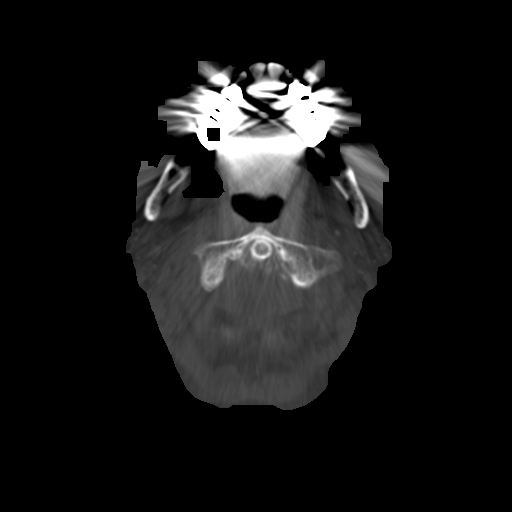
[im 5/33  brain]
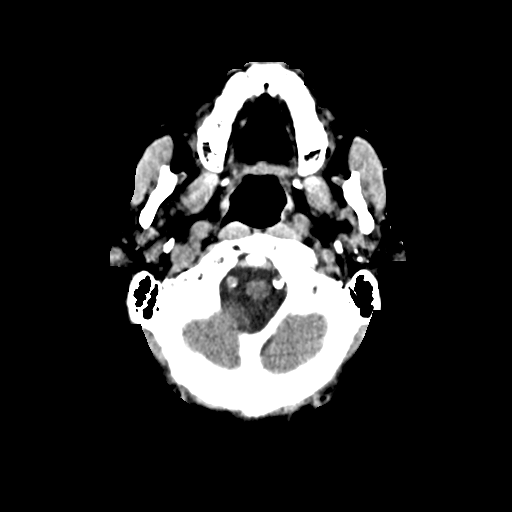
[im 10/33  brain]
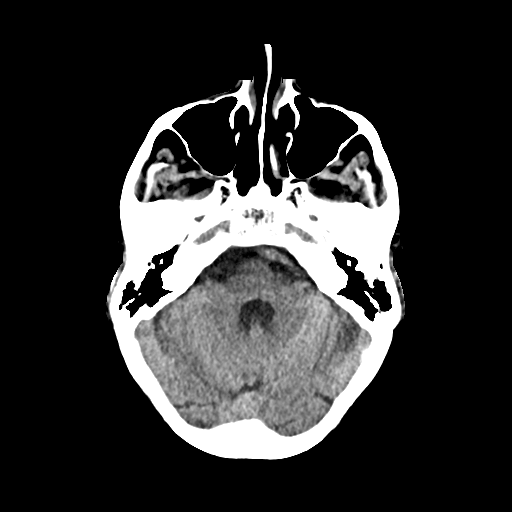
[im 12/33  brain]
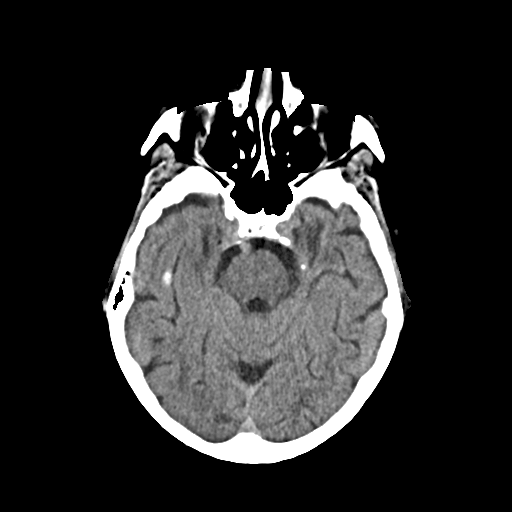
[im 14/33  brain]
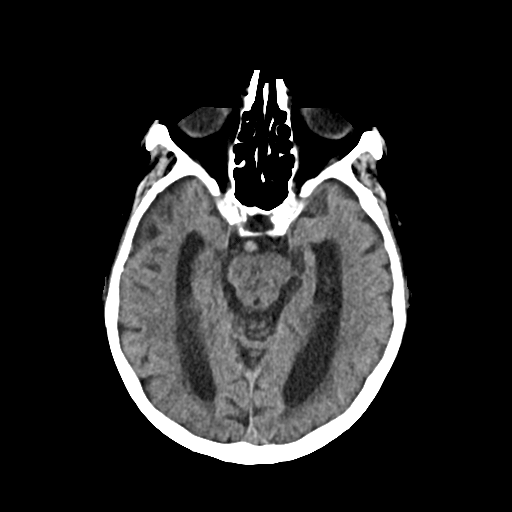
[im 14/33  bone]
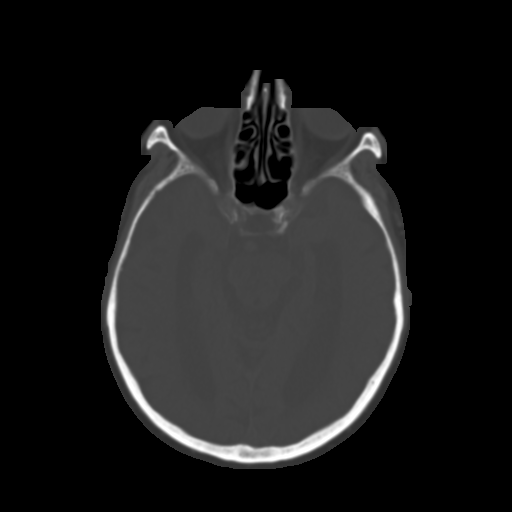
[im 19/33  brain]
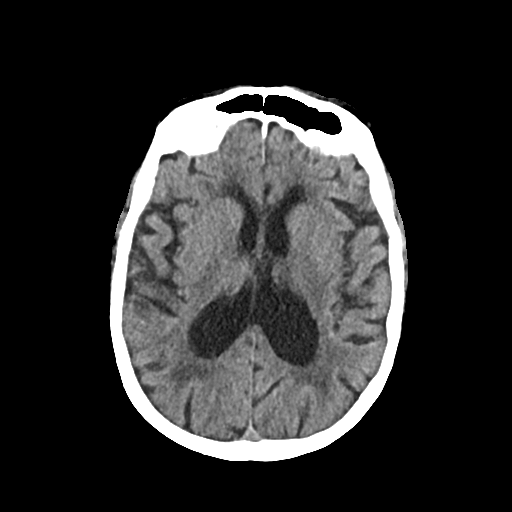
[im 21/33  brain]
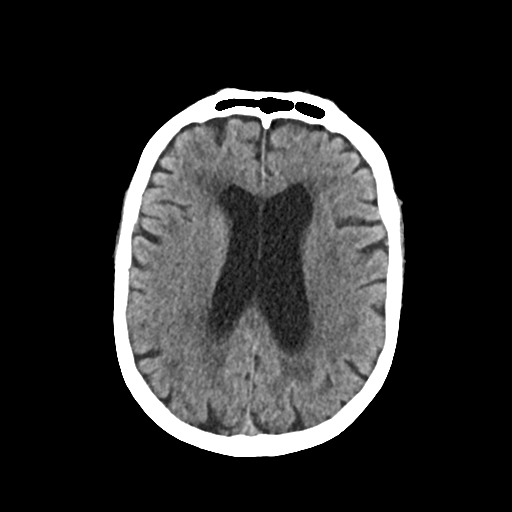
[im 23/33  brain]
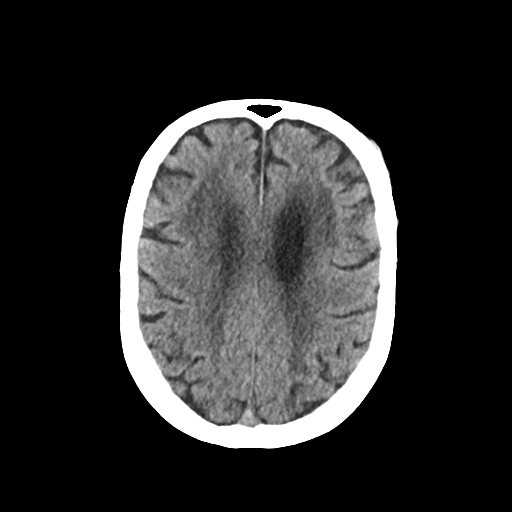
[im 28/33  brain]
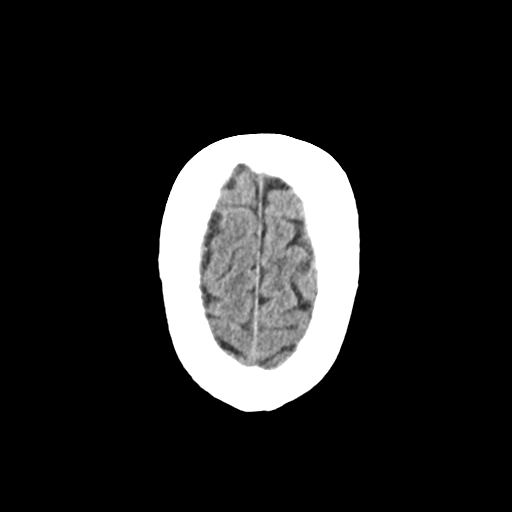
[im 28/33  bone]
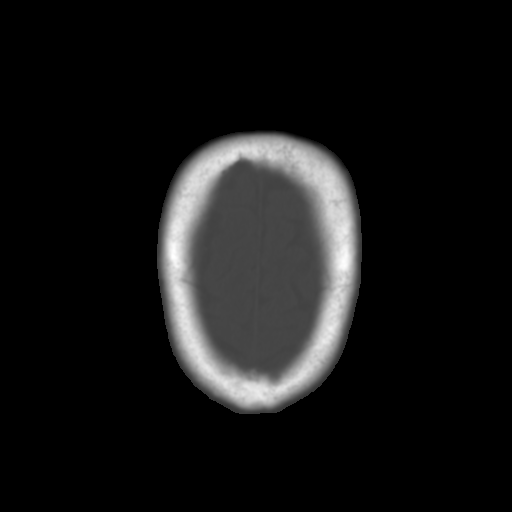
[im 30/33  brain]
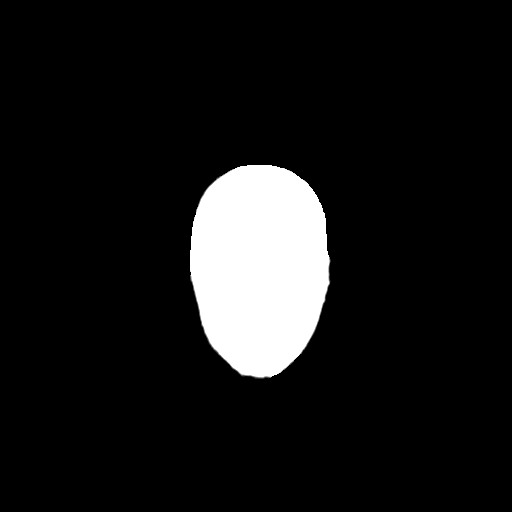

[Series 4: head 3.00 hr40 s3 sag · sagittal · 0.34mm/px · 3 of 51 slices shown]
[im 17/51  brain]
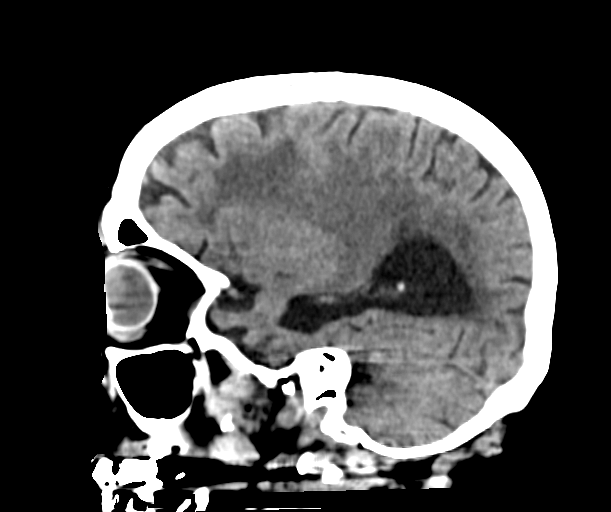
[im 26/51  brain]
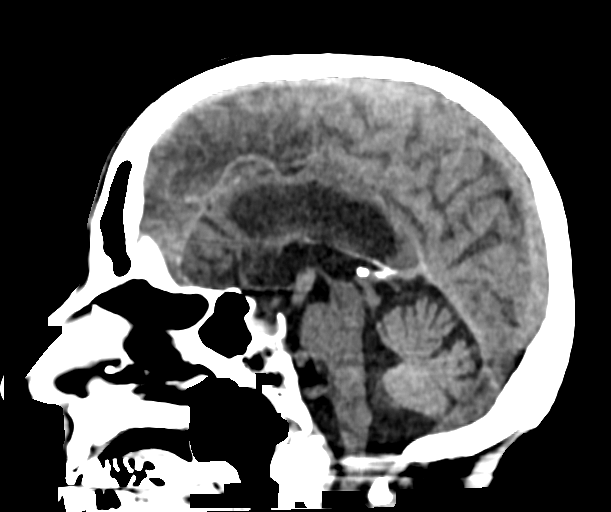
[im 34/51  brain]
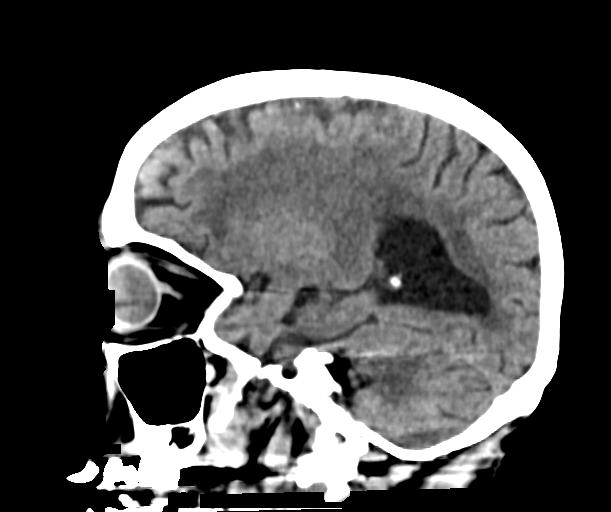

[Series 6: head 3.00 hr40 s3 cor · coronal · 0.30mm/px · 3 of 72 slices shown]
[im 24/72  brain]
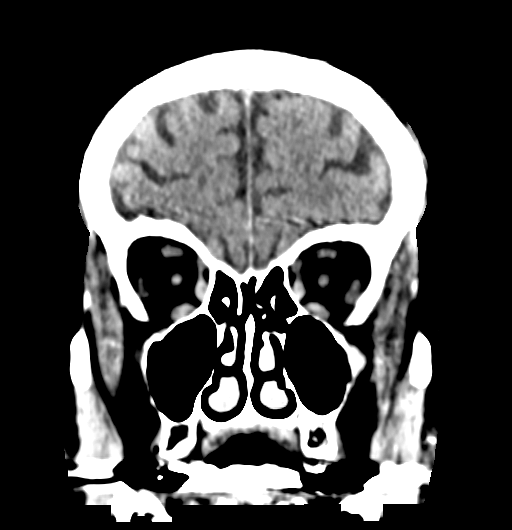
[im 32/72  brain]
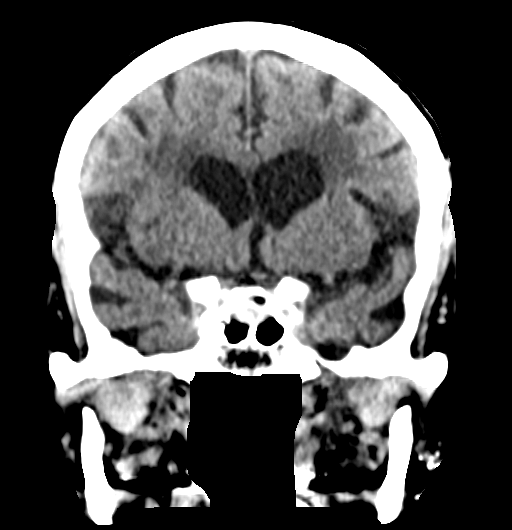
[im 40/72  brain]
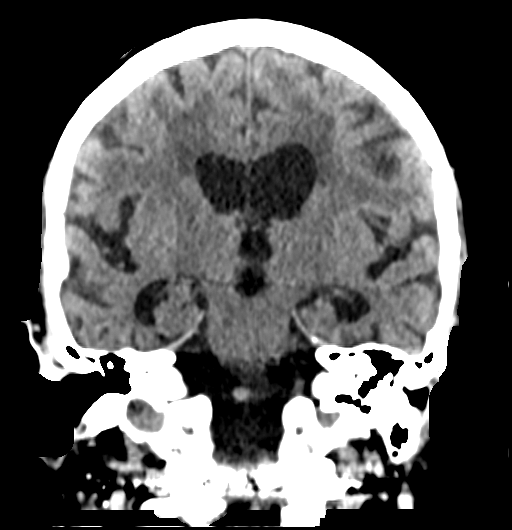

[16 of 47 positions shown; findings below may reference images not displayed]

FINDINGS: Brain: No sign of acute infarction. The brainstem and cerebellum
appear normal by CT. Cerebral hemispheres show generalized atrophy
without lobar predominance. There are extensive chronic small-vessel
ischemic changes of the cerebral hemispheric white matter, with some
deep brain volume loss and ex vacuo enlargement of the lateral
ventricles. While normal pressure hydrocephalus is not excluded, it
is not particularly suspected. No cortical or large vessel territory
infarction. No mass lesion, hemorrhage, hydrocephalus or extra-axial
collection.

Vascular: There is atherosclerotic calcification of the major
vessels at the base of the brain.

Skull: Negative

Sinuses/Orbits: Clear/normal

Other: Multiple punctate calculi within both parotid glands.
IMPRESSION: Generalized atrophy without lobar predominance. Advanced chronic
small-vessel ischemic changes of the hemispheric white matter. Ex
vacuo enlargement of the lateral ventricles favored over NPH. No
acute finding.

## 2019-02-06 DIAGNOSIS — I2510 Atherosclerotic heart disease of native coronary artery without angina pectoris: Secondary | ICD-10-CM | POA: Insufficient documentation

## 2019-02-06 DIAGNOSIS — I119 Hypertensive heart disease without heart failure: Secondary | ICD-10-CM | POA: Insufficient documentation

## 2019-02-06 DIAGNOSIS — E785 Hyperlipidemia, unspecified: Secondary | ICD-10-CM | POA: Insufficient documentation

## 2019-02-06 DIAGNOSIS — I493 Ventricular premature depolarization: Secondary | ICD-10-CM | POA: Insufficient documentation

## 2019-02-06 DIAGNOSIS — I255 Ischemic cardiomyopathy: Secondary | ICD-10-CM | POA: Insufficient documentation

## 2019-02-06 DIAGNOSIS — I447 Left bundle-branch block, unspecified: Secondary | ICD-10-CM | POA: Insufficient documentation

## 2019-02-06 NOTE — Progress Notes (Signed)
Cardiology Office Note:    Date:  02/09/2019   ID:  Leslie Park, DOB September 09, 1928, MRN [id number]  PCP:  Kendrick RanchSchoenhoff, Deborah D, MD  Cardiologist:  Norman HerrlichBrian Jospeh Mangel, MD   Referring MD: Kendrick RanchSchoenhoff, Deborah D, *  ASSESSMENT:    1. Coronary artery disease of native artery of native heart with stable angina pectoris (HCC)   2. Ischemic cardiomyopathy   3. Hypertensive heart disease without heart failure   4. LBBB (left bundle branch block)   5. Mixed hyperlipidemia   6. PVC's (premature ventricular contractions)    PLAN:    In order of problems listed above:  1. Stable CAD continue medical therapy including aspirin beta-blocker and statin.  At this time she is not having angina New York Heart Association class I and I would not advise an ischemia evaluation.  If she had evidence acute coronary syndrome coronary angiography would be appropriate. 2. Stable no evidence of heart failure continue current treatment including beta-blocker ARB. 3. Stable blood pressure target continue current treatment as above 4. Stable EKG pattern asked patient and her daughter to check blood pressure at home once or twice a week for heart rate and BP 5. Continue a statin switch to a low intensity avoid muscular side effects 6. Stable asymptomatic none present on EKG continue beta-blocker  Next appointment 1 yr   Medication Adjustments/Labs and Tests Ordered: Current medicines are reviewed at length with the patient today.  Concerns regarding medicines are outlined above.  No orders of the defined types were placed in this encounter.  No orders of the defined types were placed in this encounter.    Chief Complaint  Patient presents with  . Coronary Artery Disease     with exertional SOB  . Cardiomyopathy    with LBBB  . Hypertension    History of Present Illness:    Leslie Severancengeborg Mayeux is a 85 y.o. female who is being seen today for the evaluation of CAD at the request of Schoenhoff, Harrington ChallengerDeborah D, *.   History is [facility]2017 patient has a history of CAD PCI and stent [facility] [place] 2010 with a drug-eluting stent to the LAD first diagonal branch was jailed and PCI drug-eluting stent to obtuse marginal.  Subsequent heart catheterization showed stent patency and no further revascularization.  Other problems include what is called moderate left ventricular dysfunction, cardiomyopathy hypertension left bundle branch block hyperlipidemia symptomatic PVCs.  She had a Holter monitor performed in 2017 showed rare ventricular and supraventricular arrhythmia and no episodes of atrial fibrillation.  At that time her cholesterol is 144 LDL 59 HDL 70.  An echocardiogram was ordered but does not seem to have been performed.  She is with her daughter who is an ultrasound tech at [employer] [employer].  The goal is to establish cardiology care and her daughter is taken her into her home in the last few years with dementia.  She has a paucity of symptoms and daughter says they walk outdoors she is a little breathless keeping up with therapy it is rare it is exertional and the daughter thinks it is appropriate for her age.  No edema orthopnea chest pain palpitation or syncope.  I reviewed the history available in the chart and the daughter concurs.  We discussed goals of treatment and the potential of doing cardiac testing myocardial perfusion study echocardiograms monitors and provider the quality of life is good and medications to control symptoms we  will not perform cardiac diagnostic testing.  I will switch her from high intensity statin the low intensity with her age and body mass she is at risk for muscular toxicity and I think in this case I would not try to achieve LDLs of less than 70. Past Medical History:  Diagnosis Date  . Coronary artery disease   . Hyperlipidemia   . Hypertension     Past Surgical History:  Procedure Laterality  Date  . BACK SURGERY    . CARDIAC CATHETERIZATION    . CORONARY ANGIOPLASTY WITH STENT PLACEMENT    . ELBOW FRACTURE SURGERY      Current Medications: Current Meds  Medication Sig  . aspirin EC 81 MG tablet Take 81 mg by mouth every other day.   Marland Kitchen atorvastatin (LIPITOR) 20 MG tablet Take 20 mg by mouth daily.   . Calcium Carb-Cholecalciferol (CALCIUM 600 + D PO) Take 1 tablet by mouth daily.  . carvedilol (COREG) 25 MG tablet Take 25 mg by mouth 2 (two) times daily with a meal.   . cevimeline (EVOXAC) 30 MG capsule Take 30 mg by mouth 2 (two) times daily.  . hydroxychloroquine (PLAQUENIL) 200 MG tablet Take 200 mg by mouth daily.  . mirabegron ER (MYRBETRIQ) 50 MG TB24 tablet Take 50 mg by mouth daily.   . nitroGLYCERIN (NITROSTAT) 0.4 MG SL tablet Place 0.4 mg under the tongue every 5 (five) minutes as needed.   . rivastigmine (EXELON) 1.5 MG capsule Take 1 capsule (1.5 mg total) by mouth 2 (two) times daily.  Marland Kitchen telmisartan (MICARDIS) 20 MG tablet Take 20 mg by mouth daily.   . traZODone (DESYREL) 100 MG tablet Take 100 mg by mouth at bedtime as needed.      Allergies:   Codeine and Penicillin g   Social History   Socioeconomic History  . Marital status: Widowed    Spouse name: Not on file  . Number of children: Not on file  . Years of education: Not on file  . Highest education level: Not on file  Occupational History  . Not on file  Social Needs  . Financial resource strain: Not on file  . Food insecurity:    Worry: Not on file    Inability: Not on file  . Transportation needs:    Medical: Not on file    Non-medical: Not on file  Tobacco Use  . Smoking status: Former Smoker    Types: Cigarettes    Last attempt to quit: 1980    Years since quitting: 40.1  . Smokeless tobacco: Never Used  Substance and Sexual Activity  . Alcohol use: Never    Frequency: Never  . Drug use: Never  . Sexual activity: Not Currently  Lifestyle  . Physical activity:    Days per  week: Not on file    Minutes per session: Not on file  . Stress: Not on file  Relationships  . Social connections:    Talks on phone: Not on file    Gets together: Not on file    Attends religious service: Not on file    Active member of club or organization: Not on file    Attends meetings of clubs or organizations: Not on file    Relationship status: Not on file  Other Topics Concern  . 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  ROS:   Review of Systems  Constitution: Negative.  HENT: Negative.   Eyes: Negative.   Cardiovascular: Positive for dyspnea on exertion.  Respiratory: Positive for shortness of breath.   Endocrine: Negative.   Hematologic/Lymphatic: Negative.   Skin: Negative.   Musculoskeletal: Negative.   Gastrointestinal: Negative.   Genitourinary: Negative.   Neurological: Negative.   Psychiatric/Behavioral: Positive for memory loss.  Allergic/Immunologic: Negative.    Please see the history of present illness.     All other systems reviewed and are negative.  EKGs/Labs/Other Studies Reviewed:    The following studies were reviewed today:   EKG:  EKG is  ordered today.  The ekg ordered today demonstrates left bundle branch block stable pattern  Recent Labs:   CMP shows a glucose of 96 normal liver function and normal CBC  No results found for requested labs within last 8760 hours.  Recent Lipid Panel   cholesterol 195 triglyceride 82 HDL 67 LDL 111   07/09/2018 No results found for: CHOL, TRIG, HDL, CHOLHDL, VLDL, LDLCALC, LDLDIRECT  Physical Exam:    VS:  BP (!) 144/94 (BP Location: Right Arm, Patient Position: Sitting, Cuff Size: Small)   Pulse 66   Ht 5\' 1"  (1.549 m)   Wt 93 lb 1.9 oz (42.2 kg)   SpO2 94%   BMI 17.59 kg/m     Wt  Readings from Last 3 Encounters:  02/09/19 93 lb 1.9 oz (42.2 kg)  09/05/18 93 lb (42.2 kg)     GEN: Thin habitus Well nourished, well developed in no acute distress HEENT: Normal NECK: No JVD; No carotid bruits LYMPHATICS: No lymphadenopathy CARDIAC: RRR, no murmurs, rubs, gallops RESPIRATORY:  Clear to auscultation without rales, wheezing or rhonchi  ABDOMEN: Soft, non-tender, non-distended MUSCULOSKELETAL:  No edema; No deformity  SKIN: Warm and dry NEUROLOGIC:  Alert and oriented x 3 PSYCHIATRIC:  Normal affect     Signed, Norman Herrlich, MD  02/09/2019 9:19 AM    [facility]

## 2019-02-09 ENCOUNTER — Encounter: Payer: Self-pay | Admitting: Cardiology

## 2019-02-09 ENCOUNTER — Ambulatory Visit (INDEPENDENT_AMBULATORY_CARE_PROVIDER_SITE_OTHER): Payer: Medicare Other | Admitting: Cardiology

## 2019-02-09 VITALS — BP 144/94 | HR 66 | Ht 61.0 in | Wt 93.1 lb

## 2019-02-09 DIAGNOSIS — I255 Ischemic cardiomyopathy: Secondary | ICD-10-CM | POA: Diagnosis not present

## 2019-02-09 DIAGNOSIS — I25118 Atherosclerotic heart disease of native coronary artery with other forms of angina pectoris: Secondary | ICD-10-CM

## 2019-02-09 DIAGNOSIS — E782 Mixed hyperlipidemia: Secondary | ICD-10-CM

## 2019-02-09 DIAGNOSIS — I447 Left bundle-branch block, unspecified: Secondary | ICD-10-CM

## 2019-02-09 DIAGNOSIS — I119 Hypertensive heart disease without heart failure: Secondary | ICD-10-CM | POA: Diagnosis not present

## 2019-02-09 DIAGNOSIS — I493 Ventricular premature depolarization: Secondary | ICD-10-CM

## 2019-02-09 MED ORDER — PRAVASTATIN SODIUM 20 MG PO TABS: 20.0000 mg | ORAL_TABLET | Freq: Every day | ORAL | 11 refills | Status: AC

## 2019-02-09 NOTE — Patient Instructions (Addendum)
Medication Instructions:  Your physician has recommended you make the following change in your medication:  STOP taking lipitor START taking pravastatin 20 mg (1 Tablet) daily   If you need a refill on your cardiac medications before your next appointment, please call your pharmacy.   Lab work: NONE  Testing/Procedures: An EKG was performed today.  Follow-Up: At [facility], you and your health needs are our priority.  As part of our continuing mission to provide you with exceptional heart care, we have created designated Provider Care Teams.  These Care Teams include your primary Cardiologist (physician) and Advanced Practice Providers (APPs -  Physician Assistants and Nurse Practitioners) who all work together to provide you with the care you need, when you need it. You will need a follow up appointment in 1 years.  Please call our office 2 months in advance to schedule this appointment.  You may see No primary care provider on file.    Any Other Special Instructions Will Be Listed Below  Pravastatin tablets What is this medicine? PRAVASTATIN (PRA va stat in) is known as a HMG-CoA reductase inhibitor or 'statin'. It lowers the level of cholesterol and triglycerides in the blood. This drug may also reduce the risk of heart attack, stroke, or other health problems in patients with risk factors for heart disease. Diet and lifestyle changes are often used with this drug. This medicine may be used for other purposes; ask your health care provider or pharmacist if you have questions. COMMON BRAND NAME(S): Pravachol What should I tell my health care provider before I take this medicine? They need to know if you have any of these conditions: -diabetes -if you often drink alcohol -history of stroke -kidney disease -liver disease -muscle aches or weakness -thyroid disease -an unusual or allergic reaction to pravastatin, other medicines, foods, dyes, or preservatives -pregnant or trying  to get pregnant -breast-feeding How should I use this medicine? Take pravastatin tablets by mouth. Swallow the tablets with a drink of water. Pravastatin can be taken at anytime of the day, with or without food. Follow the directions on the prescription label. Take your doses at regular intervals. Do not take your medicine more often than directed. Talk to your pediatrician regarding the use of this medicine in children. Special care may be needed. Pravastatin has been used in children as young as 64 years of age. Overdosage: If you think you have taken too much of this medicine contact a poison control center or emergency room at once. NOTE: This medicine is only for you. Do not share this medicine with others. What if I miss a dose? If you miss a dose, take it as soon as you can. If it is almost time for your next dose, take only that dose. Do not take double or extra doses. What may interact with this medicine? This medicine may interact with the following medications: -colchicine -cyclosporine -other medicines for high cholesterol -some antibiotics like azithromycin, clarithromycin, erythromycin, and telithromycin This list may not describe all possible interactions. Give your health care provider a list of all the medicines, herbs, non-prescription drugs, or dietary supplements you use. Also tell them if you smoke, drink alcohol, or use illegal drugs. Some items may interact with your medicine. What should I watch for while using this medicine? Visit your doctor or health care professional for regular check-ups. You may need regular tests to make sure your liver is working properly. Your health care professional may tell you to stop taking  this medicine if you develop muscle problems. If your muscle problems do not go away after stopping this medicine, contact your health care professional. Do not become pregnant while taking this medicine. Women should inform their health care professional if  they wish to become pregnant or think they might be pregnant. There is a potential for serious side effects to an unborn child. Talk to your health care professional or pharmacist for more information. Do not breast-feed an infant while taking this medicine. This medicine may affect blood sugar levels. If you have diabetes, check with your doctor or health care professional before you change your diet or the dose of your diabetic medicine. If you are going to need surgery or other procedure, tell your doctor that you are using this medicine. This drug is only part of a total heart-health program. Your doctor or a dietician can suggest a low-cholesterol and low-fat diet to help. Avoid alcohol and smoking, and keep a proper exercise schedule. This medicine may cause a decrease in Co-Enzyme Q-10. You should make sure that you get enough Co-Enzyme Q-10 while you are taking this medicine. Discuss the foods you eat and the vitamins you take with your health care professional. What side effects may I notice from receiving this medicine? Side effects that you should report to your doctor or health care professional as soon as possible: -allergic reactions like skin rash, itching or hives, swelling of the face, lips, or tongue -dark urine -fever -muscle pain, cramps, or weakness -redness, blistering, peeling or loosening of the skin, including inside the mouth -trouble passing urine or change in the amount of urine -unusually weak or tired -yellowing of the eyes or skin Side effects that usually do not require medical attention (report to your doctor or health care professional if they continue or are bothersome): -gas -headache -heartburn -indigestion -stomach pain This list may not describe all possible side effects. Call your doctor for medical advice about side effects. You may report side effects to FDA at 1-800-FDA-1088. Where should I keep my medicine? Keep out of the reach of children. Store  at room temperature between 15 to 30 degrees C (59 to 86 degrees F). Protect from light. Keep container tightly closed. Throw away any unused medicine after the expiration date. NOTE: This sheet is a summary. It may not cover all possible information. If you have questions about this medicine, talk to your doctor, pharmacist, or health care provider.  2019 Elsevier/Gold Standard (2017-08-06 12:37:09)

## 2019-03-24 ENCOUNTER — Encounter: Payer: Self-pay | Admitting: Neurology

## 2019-04-01 ENCOUNTER — Ambulatory Visit: Payer: TRICARE For Life (TFL) | Admitting: Neurology

## 2019-07-06 ENCOUNTER — Other Ambulatory Visit: Payer: Self-pay | Admitting: Cardiology

## 2019-07-06 MED ORDER — TELMISARTAN 20 MG PO TABS: 20.0000 mg | ORAL_TABLET | Freq: Every day | ORAL | 0 refills | Status: DC

## 2019-07-06 NOTE — Telephone Encounter (Signed)
°*  STAT* If patient is at the pharmacy, call can be transferred to refill team.   1. Which medications need to be refilled? (please list name of each medication and dose if known) telmisartan (MICARDIS) 20 MG    2. Which pharmacy/location (including street and city if local pharmacy) is medication to be sent to?  [facility] - [address], [place] [phone] (Phone) [phone] (Fax)    3. Do they need a 30 day or 90 day supply? 90 day

## 2020-02-17 ENCOUNTER — Other Ambulatory Visit: Payer: Self-pay | Admitting: Cardiology

## 2020-03-25 ENCOUNTER — Other Ambulatory Visit: Payer: Self-pay | Admitting: Cardiology
